# Patient Record
Sex: Female | Born: 1985
Health system: Southern US, Community
[De-identification: ages and names within clinical notes are randomized; demographics above are authoritative.]

## PROBLEM LIST (undated history)

## (undated) DIAGNOSIS — N200 Calculus of kidney: Secondary | ICD-10-CM

## (undated) DIAGNOSIS — A749 Chlamydial infection, unspecified: Secondary | ICD-10-CM

## (undated) DIAGNOSIS — R51 Headache: Secondary | ICD-10-CM

## (undated) HISTORY — PX: ANTERIOR CRUCIATE LIGAMENT REPAIR: SHX115

## (undated) HISTORY — PX: OTHER SURGICAL HISTORY: SHX169

---

## 2009-06-15 ENCOUNTER — Ambulatory Visit: Payer: Self-pay | Admitting: Diagnostic Radiology

## 2009-06-15 ENCOUNTER — Emergency Department (HOSPITAL_BASED_OUTPATIENT_CLINIC_OR_DEPARTMENT_OTHER): Admission: EM | Admit: 2009-06-15 | Discharge: 2009-06-15 | Payer: Self-pay | Admitting: Emergency Medicine

## 2009-07-14 ENCOUNTER — Emergency Department (HOSPITAL_BASED_OUTPATIENT_CLINIC_OR_DEPARTMENT_OTHER): Admission: EM | Admit: 2009-07-14 | Discharge: 2009-07-14 | Payer: Self-pay | Admitting: Emergency Medicine

## 2009-12-29 ENCOUNTER — Emergency Department (HOSPITAL_BASED_OUTPATIENT_CLINIC_OR_DEPARTMENT_OTHER): Admission: EM | Admit: 2009-12-29 | Discharge: 2009-12-29 | Payer: Self-pay | Admitting: Emergency Medicine

## 2010-08-21 LAB — WET PREP, GENITAL
Trich, Wet Prep: NONE SEEN
Yeast Wet Prep HPF POC: NONE SEEN

## 2010-08-21 LAB — URINALYSIS, ROUTINE W REFLEX MICROSCOPIC
Glucose, UA: NEGATIVE mg/dL
Ketones, ur: NEGATIVE mg/dL
Protein, ur: NEGATIVE mg/dL
Urobilinogen, UA: 1 mg/dL (ref 0.0–1.0)

## 2010-08-21 LAB — URINE MICROSCOPIC-ADD ON

## 2010-08-21 LAB — GC/CHLAMYDIA PROBE AMP, GENITAL: GC Probe Amp, Genital: NEGATIVE

## 2010-08-22 LAB — URINALYSIS, ROUTINE W REFLEX MICROSCOPIC
Bilirubin Urine: NEGATIVE
Hgb urine dipstick: NEGATIVE
Ketones, ur: NEGATIVE mg/dL
Nitrite: NEGATIVE

## 2010-08-22 LAB — PREGNANCY, URINE: Preg Test, Ur: NEGATIVE

## 2010-08-26 LAB — URINALYSIS, ROUTINE W REFLEX MICROSCOPIC
Glucose, UA: NEGATIVE mg/dL
Hgb urine dipstick: NEGATIVE
Specific Gravity, Urine: 1.021 (ref 1.005–1.030)
pH: 6.5 (ref 5.0–8.0)

## 2011-02-25 ENCOUNTER — Emergency Department (HOSPITAL_BASED_OUTPATIENT_CLINIC_OR_DEPARTMENT_OTHER)
Admission: EM | Admit: 2011-02-25 | Discharge: 2011-02-25 | Disposition: A | Discharge status: Home / Self Care | Payer: Self-pay | Attending: Emergency Medicine | Admitting: Emergency Medicine

## 2011-02-25 ENCOUNTER — Encounter: Payer: Self-pay | Admitting: *Deleted

## 2011-02-25 DIAGNOSIS — R109 Unspecified abdominal pain: Secondary | ICD-10-CM | POA: Insufficient documentation

## 2011-02-25 DIAGNOSIS — Z3400 Encounter for supervision of normal first pregnancy, unspecified trimester: Secondary | ICD-10-CM

## 2011-02-25 DIAGNOSIS — O2690 Pregnancy related conditions, unspecified, unspecified trimester: Secondary | ICD-10-CM | POA: Insufficient documentation

## 2011-02-25 HISTORY — DX: Calculus of kidney: N20.0

## 2011-02-25 LAB — WET PREP, GENITAL
Trich, Wet Prep: NONE SEEN
Yeast Wet Prep HPF POC: NONE SEEN

## 2011-02-25 LAB — URINALYSIS, ROUTINE W REFLEX MICROSCOPIC
Hgb urine dipstick: NEGATIVE
Protein, ur: NEGATIVE mg/dL
Urobilinogen, UA: 1 mg/dL (ref 0.0–1.0)

## 2011-02-25 MED ORDER — PRENATAL COMPLETE 14-0.4 MG PO TABS: 1.0000 | ORAL_TABLET | ORAL | Status: DC

## 2011-02-25 NOTE — ED Provider Notes (Signed)
History     CSN: [id number] Arrival date & time: 02/25/2011  7:29 PM  Chief Complaint  Patient presents with  . Abdominal Pain    HPI  (Consider location/radiation/quality/duration/timing/severity/associated sxs/prior treatment)  Patient is a 25 y.o. female presenting with abdominal pain. The history is provided by the patient. No language interpreter was used.  Abdominal Pain The primary symptoms of the illness do not include abdominal pain, nausea or vomiting. The current episode started 2 days ago. The onset of the illness was gradual. The problem has not changed since onset. Associated with: cramping with urination. The patient states that she believes she is currently not pregnant. The patient has not had a change in bowel habit. Symptoms associated with the illness do not include constipation, urgency, hematuria, frequency or back pain. Significant associated medical issues do not include PUD, GERD, inflammatory bowel disease, diabetes or diverticulitis.  Pt complains of lower abdominal cramping.  Pt reports she feels like she is cramping like when she has her period when she urinates.  Pt reports period is late.  Pt reports she is possibly pregnant.  Past Medical History  Diagnosis Date  . Kidney calculi     Past Surgical History  Procedure Date  . Joint replacement     History reviewed. No pertinent family history.  History  Substance Use Topics  . Smoking status: Current Everyday Smoker -- 1.0 packs/day  . Smokeless tobacco: Not on file  . Alcohol Use:     OB History    Grav Para Term Preterm Abortions TAB SAB Ect Mult Living                  Review of Systems  Review of Systems  Gastrointestinal: Negative for nausea, vomiting, abdominal pain and constipation.  Genitourinary: Negative for urgency, frequency and hematuria.  Musculoskeletal: Negative for back pain.  All other systems reviewed and are negative.    Allergies  Review of patient's allergies  indicates no known allergies.  Home Medications  No current outpatient prescriptions on file.  Physical Exam    BP 130/81  Pulse 108  Temp(Src) 98.2 F (36.8 C) (Oral)  Resp 16  Ht 5\' 1"  (1.549 m)  Wt 170 lb (77.111 kg)  BMI 32.12 kg/m2  SpO2 100%  LMP 01/11/2011  Physical Exam  Nursing note and vitals reviewed. Constitutional: She is oriented to person, place, and time. She appears well-developed and well-nourished.  HENT:  Head: Normocephalic and atraumatic.  Eyes: Conjunctivae and EOM are normal. Pupils are equal, round, and reactive to light.  Neck: Normal range of motion. Neck supple.  Cardiovascular: Normal rate.   Pulmonary/Chest: Effort normal and breath sounds normal.  Abdominal: Soft.  Musculoskeletal: Normal range of motion.  Neurological: She is alert and oriented to person, place, and time.  Skin: Skin is warm.  Psychiatric: She has a normal mood and affect.    ED Course  Procedures (including critical care time)   Labs Reviewed  URINALYSIS, ROUTINE W REFLEX MICROSCOPIC  PREGNANCY, URINE   No results found.   No diagnosis found.   MDM   Urine pregnancy positive,  U/A negative        Langston Masker, [place] 02/25/11 2026

## 2011-02-25 NOTE — ED Notes (Signed)
Pt c/o painful urination and lower abd pain

## 2011-02-26 ENCOUNTER — Encounter (HOSPITAL_COMMUNITY): Payer: Self-pay | Admitting: *Deleted

## 2011-02-26 ENCOUNTER — Inpatient Hospital Stay (HOSPITAL_COMMUNITY)
Admission: AD | Admit: 2011-02-26 | Discharge: 2011-02-26 | Disposition: A | Discharge status: Home / Self Care | Payer: Self-pay | Source: Ambulatory Visit | Attending: Obstetrics & Gynecology | Admitting: Obstetrics & Gynecology

## 2011-02-26 ENCOUNTER — Inpatient Hospital Stay (HOSPITAL_COMMUNITY): Payer: Self-pay

## 2011-02-26 DIAGNOSIS — R1031 Right lower quadrant pain: Secondary | ICD-10-CM | POA: Insufficient documentation

## 2011-02-26 DIAGNOSIS — R109 Unspecified abdominal pain: Secondary | ICD-10-CM

## 2011-02-26 DIAGNOSIS — O26899 Other specified pregnancy related conditions, unspecified trimester: Secondary | ICD-10-CM

## 2011-02-26 DIAGNOSIS — O99891 Other specified diseases and conditions complicating pregnancy: Secondary | ICD-10-CM | POA: Insufficient documentation

## 2011-02-26 LAB — CBC
HCT: 34.3 % — ABNORMAL LOW (ref 36.0–46.0)
Platelets: 303 10*3/uL (ref 150–400)
RBC: 3.99 MIL/uL (ref 3.87–5.11)
RDW: 12.9 % (ref 11.5–15.5)
WBC: 13.3 10*3/uL — ABNORMAL HIGH (ref 4.0–10.5)

## 2011-02-26 LAB — HCG, QUANTITATIVE, PREGNANCY: hCG, Beta Chain, Quant, S: 12417 m[IU]/mL — ABNORMAL HIGH (ref <5)

## 2011-02-26 NOTE — Progress Notes (Signed)
Pt states, " I was seen at [facility], and was discharged at around 9 pm. I have been having pain in my low abdomen for 2 wks. It is crampie on my right side."

## 2011-02-26 NOTE — ED Provider Notes (Signed)
History   Pt presents today c/o RLQ pain that comes and goes for the past 2 wks. She was seen earlier at [facility] and was sent to the MAU to r/o ectopic pregnancy. She denies vag dc, bleeding, fever, or any other sx at this time.  Chief Complaint  Patient presents with  . Abdominal Pain   HPI  OB History    Grav Para Term Preterm Abortions TAB SAB Ect Mult Living   1               Past Medical History  Diagnosis Date  . Kidney calculi     Past Surgical History  Procedure Date  . Joint replacement     No family history on file.  History  Substance Use Topics  . Smoking status: Current Everyday Smoker -- 1.0 packs/day  . Smokeless tobacco: Not on file  . Alcohol Use:     Allergies: No Known Allergies  Prescriptions prior to admission  Medication Sig Dispense Refill  . ibuprofen (ADVIL,MOTRIN) 200 MG tablet Take 200 mg by mouth every 6 (six) hours as needed. For pain       . Prenatal Vit-Fe Fumarate-FA (PRENATAL COMPLETE) 14-0.4 MG TABS Take 1 tablet by mouth 1 day or 1 dose.  60 each  0    Review of Systems  Constitutional: Negative for fever.  Cardiovascular: Negative for chest pain.  Gastrointestinal: Positive for abdominal pain. Negative for nausea, vomiting, diarrhea and constipation.  Genitourinary: Negative for dysuria, urgency, frequency and hematuria.  Neurological: Negative for dizziness and headaches.  Psychiatric/Behavioral: Negative for depression and suicidal ideas.   Physical Exam   Blood pressure 114/65, pulse 84, temperature 99.1 F (37.3 C), temperature source Oral, resp. rate 20, height 5' 6.75" (1.695 m), weight 166 lb 6 oz (75.467 kg), last menstrual period 01/11/2011.  Physical Exam  Constitutional: She is oriented to person, place, and time. She appears well-developed and well-nourished. No distress.  HENT:  Head: Normocephalic and atraumatic.  Eyes: EOM are normal. Pupils are equal, round, and reactive to light.  GI: Soft. She exhibits no  distension and no mass. There is no tenderness. There is no rebound and no guarding.  Neurological: She is alert and oriented to person, place, and time.  Skin: Skin is warm and dry. She is not diaphoretic.  Psychiatric: She has a normal mood and affect. Her behavior is normal. Judgment and thought content normal.    MAU Course  Procedures  Results for orders placed during the hospital encounter of 02/26/11 (from the past 24 hour(s))  CBC     Status: Abnormal   Collection Time   02/26/11  2:14 AM      Component Value Range   WBC 13.3 (*) 4.0 - 10.5 (K/uL)   RBC 3.99  3.87 - 5.11 (MIL/uL)   Hemoglobin 11.7 (*) 12.0 - 15.0 (g/dL)   HCT 08.6 (*) 57.8 - 46.0 (%)   MCV 86.0  78.0 - 100.0 (fL)   MCH 29.3  26.0 - 34.0 (pg)   MCHC 34.1  30.0 - 36.0 (g/dL)   RDW 46.9  62.9 - 52.8 (%)   Platelets 303  150 - 400 (K/uL)  HCG, QUANTITATIVE, PREGNANCY     Status: Abnormal   Collection Time   02/26/11  2:14 AM      Component Value Range   hCG, Beta Chain, Quant, S 12417 (*) <5 (mIU/mL)   US shows single IUP at 5.6wks with cardiac activity.  Assessment and  Plan  Preg: discussed with pt at length. She will begin prenatal care. Discussed diet, activity, risks, and precautions.  Clinton Gallant. Wessley Emert III, DrHSc, MPAS, PA-C  02/26/2011, 2:24 AM   Henrietta Hoover, PA 02/26/11 [phone]

## 2011-02-26 NOTE — ED Notes (Signed)
Henrietta Hoover PA in to discuss u/a results and plan for d/c

## 2011-02-28 LAB — GC/CHLAMYDIA PROBE AMP, GENITAL: GC Probe Amp, Genital: NEGATIVE

## 2011-03-06 NOTE — ED Provider Notes (Signed)
Medical screening examination/treatment/procedure(s) were performed by non-physician practitioner and as supervising physician I was immediately available for consultation/collaboration.  Nelia Shi, MD 03/06/11 0001

## 2011-04-01 ENCOUNTER — Inpatient Hospital Stay (HOSPITAL_COMMUNITY)
Admission: AD | Admit: 2011-04-01 | Discharge: 2011-04-02 | Disposition: A | Discharge status: Home / Self Care | Payer: Medicaid Other | Source: Ambulatory Visit | Attending: Family Medicine | Admitting: Family Medicine

## 2011-04-01 ENCOUNTER — Encounter (HOSPITAL_COMMUNITY): Payer: Self-pay

## 2011-04-01 DIAGNOSIS — O26899 Other specified pregnancy related conditions, unspecified trimester: Secondary | ICD-10-CM

## 2011-04-01 DIAGNOSIS — R51 Headache: Secondary | ICD-10-CM | POA: Insufficient documentation

## 2011-04-01 DIAGNOSIS — O210 Mild hyperemesis gravidarum: Secondary | ICD-10-CM

## 2011-04-01 DIAGNOSIS — O99891 Other specified diseases and conditions complicating pregnancy: Secondary | ICD-10-CM | POA: Insufficient documentation

## 2011-04-01 MED ORDER — IBUPROFEN 600 MG PO TABS
600.0000 mg | ORAL_TABLET | Freq: Once | ORAL | Status: AC
  Administered 2011-04-02: 600 mg via ORAL
  Filled 2011-04-01: qty 1

## 2011-04-01 NOTE — Progress Notes (Addendum)
Pt states bilateral temporal headache that she describes as throbbing & stabbing, 5/10 on pain scale. Pt states has been constant headache for the last week but has had headache intermittently before that for a month. Pt states took tylenol 2 days ago but it didn't help, has not taken pain medicine since then.

## 2011-04-02 LAB — URINE MICROSCOPIC-ADD ON

## 2011-04-02 LAB — URINALYSIS, ROUTINE W REFLEX MICROSCOPIC
Bilirubin Urine: NEGATIVE
Glucose, UA: NEGATIVE mg/dL
Hgb urine dipstick: NEGATIVE
Protein, ur: NEGATIVE mg/dL
Specific Gravity, Urine: >1.03 — ABNORMAL HIGH (ref 1.005–1.030)
Urobilinogen, UA: 0.2 mg/dL (ref 0.0–1.0)

## 2011-04-02 MED ORDER — PROMETHAZINE HCL 25 MG PO TABS: 25.0000 mg | ORAL_TABLET | Freq: Four times a day (QID) | ORAL | Status: AC | PRN

## 2011-04-02 NOTE — ED Provider Notes (Addendum)
History     Chief Complaint  Patient presents with  . Headache   HPI Melinda [name] 25 y.o. 11 w gestation.  comes to MAU with constant headache which is worse tonight. Headache is 5/10. Has not taken any medication for it.  Has not yet started prenatal care.  Has been having nausea and some vomiting.   OB History    Grav Para Term Preterm Abortions TAB SAB Ect Mult Living   1 0 0 0 0 0 0 0 0 0       Past Medical History  Diagnosis Date  . Kidney calculi     Past Surgical History  Procedure Date  . Joint replacement     No family history on file.  History  Substance Use Topics  . Smoking status: Former Smoker -- 1.0 packs/day    Quit date: 02/25/2011  . Smokeless tobacco: Not on file  . Alcohol Use: No    Allergies: No Known Allergies  Prescriptions prior to admission  Medication Sig Dispense Refill  . acetaminophen (TYLENOL) 500 MG tablet Take 500 mg by mouth every 6 (six) hours as needed. For pain or headache       . Prenatal Vit-Fe Fumarate-FA (PRENATAL COMPLETE) 14-0.4 MG TABS Take 1 tablet by mouth 1 day or 1 dose.  60 each  0    Review of Systems  Gastrointestinal: Positive for nausea and vomiting.  Neurological: Positive for headaches.   Physical Exam   Blood pressure 126/65, pulse 98, temperature 98.5 F (36.9 C), temperature source Oral, resp. rate 18, height 5' 7.5" (1.715 m), weight 177 lb 9.6 oz (80.559 kg), last menstrual period 01/11/2011, SpO2 99.00%.  Physical Exam  Nursing note and vitals reviewed. Constitutional: She is oriented to person, place, and time. She appears well-developed and well-nourished.  HENT:  Head: Normocephalic.  Eyes: EOM are normal.  Neck: Neck supple.  GI:       Unable to hear FHT with doppler.  Musculoskeletal: Normal range of motion.  Neurological: She is alert and oriented to person, place, and time.  Skin: Skin is warm and dry.  Psychiatric: She has a normal mood and affect.    MAU Course   Procedures  MDM Ibuprofen 600 mg given.  Headache is now 1/10 at 1 AM.  Client ready for discharge.  Assessment and Plan  Headache Morning sickness  Plan Drink at least 8 8-oz glasses of water every day. Take Tylenol 325 mg 2 tablets by mouth every 4 hours if needed for pain. Will prescribe Phenergan for vomiting. Begin prenatal care as soon as possible. Urine culture pending.  Client denies any urinary symptoms today.  Melinda [name] 04/02/2011, 12:58 AM   Nolene Bernheim, NP 04/02/11 0103  Nolene Bernheim, NP 04/05/11 2002

## 2011-04-06 NOTE — ED Provider Notes (Signed)
Chart reviewed and agree with management and plan.  

## 2011-04-08 ENCOUNTER — Encounter (HOSPITAL_COMMUNITY): Payer: Self-pay | Admitting: *Deleted

## 2011-04-08 ENCOUNTER — Inpatient Hospital Stay (HOSPITAL_COMMUNITY)
Admission: AD | Admit: 2011-04-08 | Discharge: 2011-04-08 | Disposition: A | Discharge status: Home / Self Care | Payer: Self-pay | Source: Ambulatory Visit | Attending: Obstetrics & Gynecology | Admitting: Obstetrics & Gynecology

## 2011-04-08 DIAGNOSIS — Z3490 Encounter for supervision of normal pregnancy, unspecified, unspecified trimester: Secondary | ICD-10-CM

## 2011-04-08 DIAGNOSIS — O99891 Other specified diseases and conditions complicating pregnancy: Secondary | ICD-10-CM | POA: Insufficient documentation

## 2011-04-08 DIAGNOSIS — O209 Hemorrhage in early pregnancy, unspecified: Secondary | ICD-10-CM

## 2011-04-08 DIAGNOSIS — N898 Other specified noninflammatory disorders of vagina: Secondary | ICD-10-CM | POA: Insufficient documentation

## 2011-04-08 DIAGNOSIS — Z6791 Unspecified blood type, Rh negative: Secondary | ICD-10-CM

## 2011-04-08 DIAGNOSIS — R109 Unspecified abdominal pain: Secondary | ICD-10-CM | POA: Insufficient documentation

## 2011-04-08 DIAGNOSIS — O26899 Other specified pregnancy related conditions, unspecified trimester: Secondary | ICD-10-CM

## 2011-04-08 HISTORY — DX: Chlamydial infection, unspecified: A74.9

## 2011-04-08 HISTORY — DX: Headache: R51

## 2011-04-08 LAB — ABO/RH: ABO/RH(D): O NEG

## 2011-04-08 MED ORDER — RHO D IMMUNE GLOBULIN 1500 UNIT/2ML IJ SOLN
300.0000 ug | Freq: Once | INTRAMUSCULAR | Status: AC
  Administered 2011-04-08: 300 ug via INTRAMUSCULAR

## 2011-04-08 NOTE — ED Provider Notes (Signed)
History     Chief Complaint  Patient presents with  . Abdominal Pain   HPI 25 y.o. G1 P0 at [redacted]w[redacted]d presents with c/o brown d/c today and one episode of cramping. No other symptoms. No recent intercourse. Still deciding on PN provider.  Has list    Past Medical History  Diagnosis Date  . Headache   . Kidney calculi     stones x2 in 2011  . Chlamydia     Past Surgical History  Procedure Date  . Anterior cruciate ligament repair     right knee    History reviewed. No pertinent family history.  History  Substance Use Topics  . Smoking status: Former Smoker -- 1.0 packs/day    Quit date: 02/25/2011  . Smokeless tobacco: Never Used  . Alcohol Use: No    Allergies: No Known Allergies  Prescriptions prior to admission  Medication Sig Dispense Refill  . acetaminophen (TYLENOL) 500 MG tablet Take 1,000 mg by mouth every 6 (six) hours as needed. For pain or headache      . Prenatal Vit-Fe Fumarate-FA (PRENATAL COMPLETE) 14-0.4 MG TABS Take 1 tablet by mouth 1 day or 1 dose.  60 each  0  . promethazine (PHENERGAN) 25 MG tablet Take 1 tablet (25 mg total) by mouth every 6 (six) hours as needed for nausea. May take 1/2 tablet if desired.  Sedation precautions.  30 tablet  0    ROS See above Physical Exam   Blood pressure 118/86, pulse 85, temperature 98.5 F (36.9 C), temperature source Oral, resp. rate 20, height 5\' 8"  (1.727 m), weight 178 lb (80.74 kg), last menstrual period 01/11/2011.  Physical Exam  Constitutional: She is oriented to person, place, and time. She appears well-developed and well-nourished. No distress.  HENT:  Head: Normocephalic.  Respiratory: Effort normal.  GI: Soft. She exhibits no distension and no mass. There is no tenderness. There is no rebound and no guarding.  Genitourinary: Vagina normal and uterus normal. No vaginal discharge (No blood in vault) found.  Musculoskeletal: Normal range of motion.  Neurological: She is alert and oriented to  person, place, and time.  Skin: Skin is warm and dry.  Psychiatric: She has a normal mood and affect.   FHR 162 Blood TYpe O Neg MAU Course  Procedures  Assessment and Plan  A:  Reassuring IUP fetal status      Brown D/C probably reflects old blood, no current bleeding P:  Reassured      Discussed PN care options. Wants to deliver at [facility].     Rhophylac before discharge  [facility] 04/08/2011, 3:50 PM

## 2011-04-08 NOTE — Progress Notes (Signed)
Was at work, started cramping, went to restroom and saw dk spotting.  No hx of bleeding during pregnancy

## 2011-04-08 NOTE — ED Notes (Signed)
Pt informed of blood type, need for rhophyllac.  Time discussed.

## 2011-04-08 NOTE — ED Notes (Signed)
Explained reason for blood draw to pt.  Time frame associated with labwork and results.  Pt will go to lobby to wait on results.

## 2011-04-08 NOTE — ED Notes (Signed)
Tolerated injection.

## 2011-04-09 LAB — RH IG WORKUP (INCLUDES ABO/RH)
ABO/RH(D): O NEG
Gestational Age(Wks): 12

## 2011-04-19 ENCOUNTER — Emergency Department (HOSPITAL_BASED_OUTPATIENT_CLINIC_OR_DEPARTMENT_OTHER)
Admission: EM | Admit: 2011-04-19 | Discharge: 2011-04-19 | Disposition: A | Discharge status: Home / Self Care | Payer: Medicaid Other | Attending: Emergency Medicine | Admitting: Emergency Medicine

## 2011-04-19 ENCOUNTER — Encounter (HOSPITAL_BASED_OUTPATIENT_CLINIC_OR_DEPARTMENT_OTHER): Payer: Self-pay

## 2011-04-19 DIAGNOSIS — M25519 Pain in unspecified shoulder: Secondary | ICD-10-CM | POA: Insufficient documentation

## 2011-04-19 DIAGNOSIS — M549 Dorsalgia, unspecified: Secondary | ICD-10-CM | POA: Insufficient documentation

## 2011-04-19 HISTORY — DX: Calculus of kidney: N20.0

## 2011-04-19 NOTE — ED Provider Notes (Signed)
History     CSN: [id number] Arrival date & time: 04/19/2011  3:00 PM   First MD Initiated Contact with Patient 04/19/11 1515      Chief Complaint  Patient presents with  . Shoulder Pain  . Back Pain    (Consider location/radiation/quality/duration/timing/severity/associated sxs/prior treatment) HPI Comments: Pt states that she is sore in her right neck and shoulder:pt states that she has had similar symptoms in the past, but she was able to take muscle relaxer but can't related to pregnancy  Patient is a 25 y.o. female presenting with shoulder pain and back pain. The history is provided by the patient. No language interpreter was used.  Shoulder Pain This is a new problem. The current episode started in the past 7 days. The problem occurs constantly. The problem has been unchanged. The symptoms are aggravated by twisting. She has tried acetaminophen for the symptoms. The treatment provided mild relief.  Back Pain     Past Medical History  Diagnosis Date  . Headache   . Kidney calculi     stones x2 in 2011  . Chlamydia   . Kidney stones     Past Surgical History  Procedure Date  . Anterior cruciate ligament repair     right knee  . Right knee surgery     No family history on file.  History  Substance Use Topics  . Smoking status: Former Smoker -- 1.0 packs/day    Quit date: 02/25/2011  . Smokeless tobacco: Never Used  . Alcohol Use: No    OB History    Grav Para Term Preterm Abortions TAB SAB Ect Mult Living   1 0 0 0 0 0 0 0 0 0       Review of Systems  Constitutional: Negative.   Cardiovascular: Negative.   Musculoskeletal: Positive for back pain.  Neurological: Negative.   Psychiatric/Behavioral: Negative.     Allergies  Review of patient's allergies indicates no known allergies.  Home Medications   Current Outpatient Rx  Name Route Sig Dispense Refill  . ACETAMINOPHEN 500 MG PO TABS Oral Take 1,000 mg by mouth every 6 (six) hours as needed.  For pain or headache    . PRENATAL COMPLETE 14-0.4 MG PO TABS Oral Take 1 tablet by mouth 1 day or 1 dose. 60 each 0    BP 137/68  Pulse 103  Temp(Src) 98.3 F (36.8 C) (Oral)  Resp 16  Ht 5\' 8"  (1.727 m)  Wt 178 lb (80.74 kg)  BMI 27.06 kg/m2  SpO2 100%  LMP 01/11/2011  Physical Exam  Nursing note and vitals reviewed. Constitutional: She is oriented to person, place, and time. She appears well-developed and well-nourished.  HENT:  Head: Normocephalic and atraumatic.  Cardiovascular: Normal rate and regular rhythm.   Pulmonary/Chest: Effort normal and breath sounds normal.  Abdominal: Soft.  Musculoskeletal:       Pt tender along the right shoulder blade:no gross deformity noted:pt has full BJY:NWGNFA intact  Neurological: She is alert and oriented to person, place, and time.    ED Course  Procedures (including critical care time)  Labs Reviewed - No data to display No results found.   1. Back pain       MDM  Pt has likely muscle spasms:discussed with pt that she can take tylenol:no imagining needed as this time        Teressa Lower, NP 04/19/11 1533  Teressa Lower, NP 04/19/11 1533

## 2011-04-19 NOTE — ED Provider Notes (Signed)
History/physical exam/procedure(s) were performed by non-physician practitioner and as supervising physician I was immediately available for consultation/collaboration. I have reviewed all notes and am in agreement with care and plan.   Blaine Guiffre S Terral Cooks, MD 04/19/11 2229 

## 2011-04-19 NOTE — ED Notes (Signed)
Pt reports severe right shoulder pain radiating to neck and back.  Onset 5 days ago.

## 2012-02-22 ENCOUNTER — Emergency Department (HOSPITAL_BASED_OUTPATIENT_CLINIC_OR_DEPARTMENT_OTHER)
Admission: EM | Admit: 2012-02-22 | Discharge: 2012-02-22 | Disposition: A | Discharge status: Home / Self Care | Payer: Self-pay | Attending: Emergency Medicine | Admitting: Emergency Medicine

## 2012-02-22 ENCOUNTER — Encounter (HOSPITAL_BASED_OUTPATIENT_CLINIC_OR_DEPARTMENT_OTHER): Payer: Self-pay

## 2012-02-22 DIAGNOSIS — L02219 Cutaneous abscess of trunk, unspecified: Secondary | ICD-10-CM | POA: Insufficient documentation

## 2012-02-22 DIAGNOSIS — L0291 Cutaneous abscess, unspecified: Secondary | ICD-10-CM

## 2012-02-22 DIAGNOSIS — Z87891 Personal history of nicotine dependence: Secondary | ICD-10-CM | POA: Insufficient documentation

## 2012-02-22 MED ORDER — SULFAMETHOXAZOLE-TRIMETHOPRIM 800-160 MG PO TABS: 1.0000 | ORAL_TABLET | Freq: Two times a day (BID) | ORAL | Status: DC

## 2012-02-22 NOTE — ED Provider Notes (Signed)
History     CSN: [id number]  Arrival date & time 02/22/12  1320   None     Chief Complaint  Patient presents with  . Abscess    (Consider location/radiation/quality/duration/timing/severity/associated sxs/prior treatment) Patient is a 26 y.o. female presenting with rash. The history is provided by the patient. No language interpreter was used.  Rash  This is a new problem. The current episode started more than 2 days ago. The problem has not changed since onset.The problem is associated with nothing. There has been no fever. The rash is present on the groin and genitalia. The pain is at a severity of 2/10. The pain is mild. The treatment provided no relief.    Past Medical History  Diagnosis Date  . Headache   . Kidney calculi     stones x2 in 2011  . Chlamydia   . Kidney stones     Past Surgical History  Procedure Date  . Anterior cruciate ligament repair     right knee  . Right knee surgery     No family history on file.  History  Substance Use Topics  . Smoking status: Former Smoker -- 1.0 packs/day    Quit date: 02/25/2011  . Smokeless tobacco: Never Used  . Alcohol Use: No    OB History    Grav Para Term Preterm Abortions TAB SAB Ect Mult Living   1 0 0 0 0 0 0 0 0 0       Review of Systems  Skin: Positive for rash.  All other systems reviewed and are negative.    Allergies  Review of patient's allergies indicates no known allergies.  Home Medications   Current Outpatient Rx  Name Route Sig Dispense Refill  . PRENATAL 1 PO Oral Take by mouth.    . ACETAMINOPHEN 500 MG PO TABS Oral Take 1,000 mg by mouth every 6 (six) hours as needed. For pain or headache    . PRENATAL COMPLETE 14-0.4 MG PO TABS Oral Take 1 tablet by mouth 1 day or 1 dose. 60 each 0    BP 140/84  Pulse 95  Temp 98.4 F (36.9 C) (Oral)  Resp 18  Ht 5\' 9"  (1.753 m)  Wt 225 lb (102.059 kg)  BMI 33.23 kg/m2  SpO2 99%  LMP 02/03/2012  Breastfeeding? Unknown  Physical  Exam  Nursing note and vitals reviewed. Constitutional: She is oriented to person, place, and time. She appears well-developed and well-nourished.  HENT:  Head: Normocephalic.  Eyes: Conjunctivae normal are normal. Pupils are equal, round, and reactive to light.  Cardiovascular: Normal rate.   Pulmonary/Chest: Effort normal.  Musculoskeletal: Normal range of motion.       Open abscess left suprabubic,  Scattered pustules inner thighs  Neurological: She is alert and oriented to person, place, and time.  Skin: There is erythema.  Psychiatric: She has a normal mood and affect.    ED Course  Procedures (including critical care time)  Labs Reviewed - No data to display No results found.   1. Abscess       MDM          Elson Areas, PA 02/22/12 1445

## 2012-02-22 NOTE — ED Notes (Signed)
C/o "multiple boils" to vaginal area

## 2012-02-22 NOTE — ED Provider Notes (Signed)
Medical screening examination/treatment/procedure(s) were performed by non-physician practitioner and as supervising physician I was immediately available for consultation/collaboration.  Ethelda Chick, MD 02/22/12 [phone]

## 2013-05-12 ENCOUNTER — Emergency Department (HOSPITAL_BASED_OUTPATIENT_CLINIC_OR_DEPARTMENT_OTHER)
Admission: EM | Admit: 2013-05-12 | Discharge: 2013-05-12 | Disposition: A | Discharge status: Home / Self Care | Payer: Medicaid Other | Attending: Emergency Medicine | Admitting: Emergency Medicine

## 2013-05-12 ENCOUNTER — Encounter (HOSPITAL_BASED_OUTPATIENT_CLINIC_OR_DEPARTMENT_OTHER): Payer: Self-pay | Admitting: Emergency Medicine

## 2013-05-12 DIAGNOSIS — Z8619 Personal history of other infectious and parasitic diseases: Secondary | ICD-10-CM | POA: Insufficient documentation

## 2013-05-12 DIAGNOSIS — B9789 Other viral agents as the cause of diseases classified elsewhere: Secondary | ICD-10-CM | POA: Insufficient documentation

## 2013-05-12 DIAGNOSIS — B349 Viral infection, unspecified: Secondary | ICD-10-CM

## 2013-05-12 DIAGNOSIS — Z87891 Personal history of nicotine dependence: Secondary | ICD-10-CM | POA: Insufficient documentation

## 2013-05-12 DIAGNOSIS — J029 Acute pharyngitis, unspecified: Secondary | ICD-10-CM | POA: Insufficient documentation

## 2013-05-12 DIAGNOSIS — Z87442 Personal history of urinary calculi: Secondary | ICD-10-CM | POA: Insufficient documentation

## 2013-05-12 DIAGNOSIS — R000 Tachycardia, unspecified: Secondary | ICD-10-CM | POA: Insufficient documentation

## 2013-05-12 LAB — RAPID STREP SCREEN (MED CTR MEBANE ONLY): Streptococcus, Group A Screen (Direct): NEGATIVE

## 2013-05-12 MED ORDER — DEXAMETHASONE SODIUM PHOSPHATE 10 MG/ML IJ SOLN
10.0000 mg | Freq: Once | INTRAMUSCULAR | Status: AC
Start: 2013-05-12 — End: 2013-05-12
  Administered 2013-05-12: 10 mg via INTRAMUSCULAR
  Filled 2013-05-12: qty 1

## 2013-05-12 NOTE — ED Notes (Signed)
Patient here with sore throat x 3 days, pain to swallow. Also complains that her ears feel full and bilateral earache

## 2013-05-12 NOTE — ED Provider Notes (Signed)
CSN: [id number]     Arrival date & time 05/12/13  1351 History   First MD Initiated Contact with Patient 05/12/13 1408     Chief Complaint  Patient presents with  . Sore Throat   (Consider location/radiation/quality/duration/timing/severity/associated sxs/prior Treatment) Patient is a 27 y.o. female presenting with pharyngitis. The history is provided by the patient.  Sore Throat This is a new problem. The current episode started in the past 7 days. The problem occurs constantly. The problem has been gradually worsening. Associated symptoms include chills, congestion, a fever, a sore throat and swollen glands. Pertinent negatives include no abdominal pain, anorexia, coughing, headaches, nausea, neck pain or rash. The symptoms are aggravated by eating. She has tried drinking and rest for the symptoms.   Melinda [name] is a 27 y.o. female who presents to the ED with sore throat, fever and chills that started 3 days ago. She has taken OTC medications without relief.   Past Medical History  Diagnosis Date  . Headache(784.0)   . Kidney calculi     stones x2 in 2011  . Chlamydia   . Kidney stones    Past Surgical History  Procedure Laterality Date  . Anterior cruciate ligament repair      right knee  . Right knee surgery     No family history on file. History  Substance Use Topics  . Smoking status: Former Smoker -- 1.00 packs/day    Quit date: 02/25/2011  . Smokeless tobacco: Never Used  . Alcohol Use: No   OB History   Grav Para Term Preterm Abortions TAB SAB Ect Mult Living   1 0 0 0 0 0 0 0 0 0      Review of Systems  Constitutional: Positive for fever and chills.  HENT: Positive for congestion and sore throat. Trouble swallowing: due to pain.   Eyes: Negative for discharge and itching.  Respiratory: Negative for cough.   Gastrointestinal: Negative for nausea, abdominal pain and anorexia.  Genitourinary: Negative for dysuria, urgency and frequency.  Musculoskeletal: Negative  for back pain and neck pain.  Skin: Negative for rash.  Neurological: Negative for syncope and headaches.  Psychiatric/Behavioral: Negative for confusion. The patient is not nervous/anxious.     Allergies  Review of patient's allergies indicates no known allergies.  Home Medications   Current Outpatient Rx  Name  Route  Sig  Dispense  Refill  . acetaminophen (TYLENOL) 500 MG tablet   Oral   Take 1,000 mg by mouth every 6 (six) hours as needed. For pain or headache          BP 151/98  Pulse 118  Temp(Src) 99.2 F (37.3 C) (Oral)  Resp 18  SpO2 100% Physical Exam  Nursing note and vitals reviewed. Constitutional: She is oriented to person, place, and time. She appears well-developed and well-nourished. No distress.  HENT:  Head: Normocephalic and atraumatic.  Right Ear: Tympanic membrane normal.  Left Ear: Tympanic membrane normal.  Nose: Nose normal.  Mouth/Throat: Uvula is midline and mucous membranes are normal. Oropharyngeal exudate and posterior oropharyngeal erythema present.  Tonsils enlarged with exudate. Patient swallowing saliva without difficulty.   Eyes: EOM are normal.  Neck: Normal range of motion. Neck supple.  Cardiovascular: Tachycardia present.   Pulmonary/Chest: Effort normal and breath sounds normal.  Abdominal: Soft. There is no tenderness.  Musculoskeletal: Normal range of motion.  Lymphadenopathy:    She has cervical adenopathy.  Neurological: She is alert and oriented to person, place, and time.  No cranial nerve deficit.  Skin: Skin is warm and dry.  Psychiatric: She has a normal mood and affect. Her behavior is normal.    ED Course: Dr. Criss Alvine in to examine the patient.   Procedures  MDM  27 y.o. female with exudative pharyngitis. Negative strep screen, negative mono test. Will treat as viral pharyngitis. Decadron 10 mg IM given here in the ED. Discussed with the patient clinical and lab findings and all questioned fully answered. She will  return if any problems arise. Instructed to use salt water gargles, chloraseptic spray, ibuprofen/tylenol and follow up with PCP. Patient voices understanding.     [facility] Orlene Och, [place] 05/12/13 [phone]

## 2013-05-14 ENCOUNTER — Encounter (HOSPITAL_BASED_OUTPATIENT_CLINIC_OR_DEPARTMENT_OTHER): Payer: Self-pay | Admitting: Emergency Medicine

## 2013-05-14 ENCOUNTER — Emergency Department (HOSPITAL_BASED_OUTPATIENT_CLINIC_OR_DEPARTMENT_OTHER)
Admission: EM | Admit: 2013-05-14 | Discharge: 2013-05-14 | Disposition: A | Discharge status: Home / Self Care | Payer: Medicaid Other | Attending: Emergency Medicine | Admitting: Emergency Medicine

## 2013-05-14 ENCOUNTER — Emergency Department (HOSPITAL_BASED_OUTPATIENT_CLINIC_OR_DEPARTMENT_OTHER): Payer: Medicaid Other

## 2013-05-14 DIAGNOSIS — Z87442 Personal history of urinary calculi: Secondary | ICD-10-CM | POA: Insufficient documentation

## 2013-05-14 DIAGNOSIS — J029 Acute pharyngitis, unspecified: Secondary | ICD-10-CM | POA: Insufficient documentation

## 2013-05-14 DIAGNOSIS — Z8619 Personal history of other infectious and parasitic diseases: Secondary | ICD-10-CM | POA: Insufficient documentation

## 2013-05-14 DIAGNOSIS — J159 Unspecified bacterial pneumonia: Secondary | ICD-10-CM | POA: Insufficient documentation

## 2013-05-14 DIAGNOSIS — J189 Pneumonia, unspecified organism: Secondary | ICD-10-CM

## 2013-05-14 DIAGNOSIS — Z792 Long term (current) use of antibiotics: Secondary | ICD-10-CM | POA: Insufficient documentation

## 2013-05-14 DIAGNOSIS — Z87891 Personal history of nicotine dependence: Secondary | ICD-10-CM | POA: Insufficient documentation

## 2013-05-14 LAB — BASIC METABOLIC PANEL
BUN: 13 mg/dL (ref 6–23)
Calcium: 9.1 mg/dL (ref 8.4–10.5)
Creatinine, Ser: 0.7 mg/dL (ref 0.50–1.10)
GFR calc Af Amer: >90 mL/min (ref >90)
GFR calc non Af Amer: >90 mL/min (ref >90)
Glucose, Bld: 94 mg/dL (ref 70–99)

## 2013-05-14 LAB — CULTURE, GROUP A STREP

## 2013-05-14 MED ORDER — ONDANSETRON HCL 4 MG/2ML IJ SOLN
4.0000 mg | Freq: Once | INTRAMUSCULAR | Status: AC
  Administered 2013-05-14: 4 mg via INTRAVENOUS
  Filled 2013-05-14: qty 2

## 2013-05-14 MED ORDER — AZITHROMYCIN 250 MG PO TABS
500.0000 mg | ORAL_TABLET | Freq: Once | ORAL | Status: AC
  Administered 2013-05-14: 500 mg via ORAL
  Filled 2013-05-14: qty 2

## 2013-05-14 MED ORDER — AZITHROMYCIN 250 MG PO TABS: ORAL_TABLET | ORAL | Status: DC

## 2013-05-14 MED ORDER — KETOROLAC TROMETHAMINE 30 MG/ML IJ SOLN
30.0000 mg | Freq: Once | INTRAMUSCULAR | Status: AC
Start: 2013-05-14 — End: 2013-05-14
  Administered 2013-05-14: 30 mg via INTRAVENOUS
  Filled 2013-05-14: qty 1

## 2013-05-14 MED ORDER — SODIUM CHLORIDE 0.9 % IV BOLUS (SEPSIS)
1000.0000 mL | Freq: Once | INTRAVENOUS | Status: AC
  Administered 2013-05-14: 1000 mL via INTRAVENOUS

## 2013-05-14 NOTE — ED Provider Notes (Signed)
CSN: [id number]     Arrival date & time 05/14/13  1848 History   First MD Initiated Contact with Patient 05/14/13 1858     Chief Complaint  Patient presents with  . Cough   (Consider location/radiation/quality/duration/timing/severity/associated sxs/prior Treatment) HPI Comments: Pt state that she had had a cough sore throat and fever for multiple days:pt states that she was seen in the er 2 days ago and she was give and shot of steroids and sent home:pt states that the symptoms are not getting better and she isn't able to drink anything because of the pain in her throat, has been taking tylenol and motrin  The history is provided by the patient. No language interpreter was used.    Past Medical History  Diagnosis Date  . Headache(784.0)   . Kidney calculi     stones x2 in 2011  . Chlamydia   . Kidney stones    Past Surgical History  Procedure Laterality Date  . Anterior cruciate ligament repair      right knee  . Right knee surgery     No family history on file. History  Substance Use Topics  . Smoking status: Former Smoker -- 1.00 packs/day    Quit date: 02/25/2011  . Smokeless tobacco: Never Used  . Alcohol Use: No   OB History   Grav Para Term Preterm Abortions TAB SAB Ect Mult Living   1 0 0 0 0 0 0 0 0 0      Review of Systems  Constitutional: Negative.   HENT: Positive for sore throat.   Respiratory: Positive for cough.   Cardiovascular: Negative.     Allergies  Review of patient's allergies indicates no known allergies.  Home Medications   Current Outpatient Rx  Name  Route  Sig  Dispense  Refill  . acetaminophen (TYLENOL) 500 MG tablet   Oral   Take 1,000 mg by mouth every 6 (six) hours as needed. For pain or headache         . azithromycin (ZITHROMAX) 250 MG tablet      1 every day until finished.   4 tablet   0    BP 135/76  Pulse 101  Temp(Src) 99.7 F (37.6 C) (Oral)  Resp 16  Ht 5\' 7"  (1.702 m)  Wt 225 lb (102.059 kg)  BMI 35.23  kg/m2  SpO2 99%  LMP 05/14/2013 Physical Exam  Nursing note and vitals reviewed. Constitutional: She is oriented to person, place, and time. She appears well-developed and well-nourished.  HENT:  Right Ear: External ear normal.  Left Ear: External ear normal.  Mouth/Throat: Oropharyngeal exudate and posterior oropharyngeal erythema present.  Cardiovascular: Normal rate and regular rhythm.   Pulmonary/Chest: Effort normal and breath sounds normal.  Musculoskeletal: Normal range of motion.  Neurological: She is alert and oriented to person, place, and time.  Skin: Skin is warm and dry.  Psychiatric: She has a normal mood and affect.    ED Course  Procedures (including critical care time) Labs Review Labs Reviewed  BASIC METABOLIC PANEL   Imaging Review Dg Chest 2 View  05/14/2013   CLINICAL DATA:  Cough and fever. Sore throat. Right-sided chest pain.  EXAM: CHEST  2 VIEW  COMPARISON:  Two-view chest 06/15/2009  FINDINGS: The heart size is normal. Ill-defined airspace disease is present the right lower lobe. The left lung is clear. The visualized soft tissues and bony thorax are otherwise unremarkable.  IMPRESSION: 1. Mild right lower lobe airspace disease. While  this may represent atelectasis, early infection is not excluded. 2. Low lung volumes.   Electronically Signed   By: Gennette Pac M.D.   On: 05/14/2013 19:58    EKG Interpretation   None       MDM   1. Community acquired pneumonia    Will treat for pneumonia with antibiotics:pt feeling better after toradol and fluids    Teressa Lower, NP 05/14/13 2140

## 2013-05-14 NOTE — ED Notes (Addendum)
Cough. Was seen here 2 days ago for tonsillitis and virus. Feels no better. Last Tylenol was 5 hours ago.

## 2013-05-14 NOTE — ED Provider Notes (Signed)
Medical screening examination/treatment/procedure(s) were performed by non-physician practitioner and as supervising physician I was immediately available for consultation/collaboration.     Geoffery Lyons, MD 05/14/13 [phone]

## 2013-05-14 NOTE — ED Provider Notes (Signed)
Medical screening examination/treatment/procedure(s) were performed by non-physician practitioner and as supervising physician I was immediately available for consultation/collaboration.     Geoffery Lyons, MD 05/14/13 [phone]

## 2013-06-16 ENCOUNTER — Emergency Department (HOSPITAL_BASED_OUTPATIENT_CLINIC_OR_DEPARTMENT_OTHER)
Admission: EM | Admit: 2013-06-16 | Discharge: 2013-06-16 | Disposition: A | Discharge status: Home / Self Care | Payer: Medicaid Other | Attending: Emergency Medicine | Admitting: Emergency Medicine

## 2013-06-16 ENCOUNTER — Encounter (HOSPITAL_BASED_OUTPATIENT_CLINIC_OR_DEPARTMENT_OTHER): Payer: Self-pay | Admitting: Emergency Medicine

## 2013-06-16 DIAGNOSIS — Z87442 Personal history of urinary calculi: Secondary | ICD-10-CM | POA: Insufficient documentation

## 2013-06-16 DIAGNOSIS — R05 Cough: Secondary | ICD-10-CM | POA: Insufficient documentation

## 2013-06-16 DIAGNOSIS — Z3202 Encounter for pregnancy test, result negative: Secondary | ICD-10-CM | POA: Insufficient documentation

## 2013-06-16 DIAGNOSIS — R109 Unspecified abdominal pain: Secondary | ICD-10-CM

## 2013-06-16 DIAGNOSIS — R059 Cough, unspecified: Secondary | ICD-10-CM | POA: Insufficient documentation

## 2013-06-16 DIAGNOSIS — R1031 Right lower quadrant pain: Secondary | ICD-10-CM | POA: Insufficient documentation

## 2013-06-16 DIAGNOSIS — Z8619 Personal history of other infectious and parasitic diseases: Secondary | ICD-10-CM | POA: Insufficient documentation

## 2013-06-16 DIAGNOSIS — Z87891 Personal history of nicotine dependence: Secondary | ICD-10-CM | POA: Insufficient documentation

## 2013-06-16 DIAGNOSIS — M549 Dorsalgia, unspecified: Secondary | ICD-10-CM | POA: Insufficient documentation

## 2013-06-16 DIAGNOSIS — Z8701 Personal history of pneumonia (recurrent): Secondary | ICD-10-CM | POA: Insufficient documentation

## 2013-06-16 LAB — CBC WITH DIFFERENTIAL/PLATELET
Basophils Absolute: 0 10*3/uL (ref 0.0–0.1)
Basophils Relative: 0 % (ref 0–1)
EOS PCT: 1 % (ref 0–5)
Eosinophils Absolute: 0.1 10*3/uL (ref 0.0–0.7)
HCT: 35.1 % — ABNORMAL LOW (ref 36.0–46.0)
Hemoglobin: 11.3 g/dL — ABNORMAL LOW (ref 12.0–15.0)
LYMPHS ABS: 3.1 10*3/uL (ref 0.7–4.0)
LYMPHS PCT: 25 % (ref 12–46)
MCH: 27.6 pg (ref 26.0–34.0)
MCHC: 32.2 g/dL (ref 30.0–36.0)
MCV: 85.6 fL (ref 78.0–100.0)
Monocytes Absolute: 0.6 10*3/uL (ref 0.1–1.0)
Monocytes Relative: 5 % (ref 3–12)
NEUTROS ABS: 8.5 10*3/uL — AB (ref 1.7–7.7)
NEUTROS PCT: 69 % (ref 43–77)
PLATELETS: 320 10*3/uL (ref 150–400)
RBC: 4.1 MIL/uL (ref 3.87–5.11)
RDW: 13.7 % (ref 11.5–15.5)
WBC: 12.3 10*3/uL — AB (ref 4.0–10.5)

## 2013-06-16 LAB — URINALYSIS, ROUTINE W REFLEX MICROSCOPIC
Bilirubin Urine: NEGATIVE
Glucose, UA: NEGATIVE mg/dL
Hgb urine dipstick: NEGATIVE
KETONES UR: NEGATIVE mg/dL
NITRITE: NEGATIVE
PH: 6.5 (ref 5.0–8.0)
Protein, ur: NEGATIVE mg/dL
SPECIFIC GRAVITY, URINE: 1.026 (ref 1.005–1.030)
Urobilinogen, UA: 0.2 mg/dL (ref 0.0–1.0)

## 2013-06-16 LAB — URINE MICROSCOPIC-ADD ON

## 2013-06-16 LAB — BASIC METABOLIC PANEL
BUN: 16 mg/dL (ref 6–23)
CHLORIDE: 103 meq/L (ref 96–112)
CO2: 26 meq/L (ref 19–32)
Calcium: 9.3 mg/dL (ref 8.4–10.5)
Creatinine, Ser: 0.8 mg/dL (ref 0.50–1.10)
GFR calc Af Amer: >90 mL/min (ref >90)
GFR calc non Af Amer: >90 mL/min (ref >90)
GLUCOSE: 110 mg/dL — AB (ref 70–99)
POTASSIUM: 4.2 meq/L (ref 3.7–5.3)
SODIUM: 143 meq/L (ref 137–147)

## 2013-06-16 LAB — PREGNANCY, URINE: PREG TEST UR: NEGATIVE

## 2013-06-16 MED ORDER — OXYCODONE-ACETAMINOPHEN 5-325 MG PO TABS: 1.0000 | ORAL_TABLET | Freq: Once | ORAL | Status: DC

## 2013-06-16 NOTE — ED Provider Notes (Signed)
CSN: [id number]     Arrival date & time 06/16/13  4098 History  This chart was scribed for non-physician practitioner Kerrie Buffalo, NP working with Hurman Horn, MD by Dorothey Baseman, ED Scribe. This patient was seen in room MH07/MH07 and the patient's care was started at 9:03 PM.    Chief Complaint  Patient presents with  . Abdominal Pain   The history is provided by the patient. No language interpreter was used.   HPI Comments: Melinda [name] is a 28 y.o. Female with a history of kidney stones who presents to the Emergency Department complaining of an intermittent pain to the RLQ of the abdomen, 4/10 at its worst and 0/10 currently, with associated nausea onset earlier today. She reports an associated itching "knot" around the area. Patient is also complaining of an intermittent pain to the right side of the back that radiates to the right-sided ribs onset about 10 days ago. She denies any potential injury or trauma to the area. Patient reports an associated cough secondary to her recent diagnosis of pneumonia about a month ago. She denies emesis, fever, chills, diarrhea, sore throat, urinary frequency, dysuria, dyspareunia, vaginal discharge or bleeding. Patient reports that she has had unprotected sex recently and is not currently on birth control, but states that she does not believe she is pregnant. Patient has no other pertinent medical history.   Past Medical History  Diagnosis Date  . Headache(784.0)   . Kidney calculi     stones x2 in 2011  . Chlamydia   . Kidney stones    Past Surgical History  Procedure Laterality Date  . Anterior cruciate ligament repair      right knee  . Right knee surgery     No family history on file. History  Substance Use Topics  . Smoking status: Former Smoker -- 1.00 packs/day    Quit date: 02/25/2011  . Smokeless tobacco: Never Used  . Alcohol Use: No   OB History   Grav Para Term Preterm Abortions TAB SAB Ect Mult Living   1 0 0 0 0 0 0 0 0 0       Review of Systems  Constitutional: Negative for fever and chills.  HENT: Negative for sore throat.   Respiratory: Positive for cough.   Gastrointestinal: Positive for nausea and abdominal pain. Negative for vomiting and diarrhea.  Genitourinary: Negative for dysuria, frequency, vaginal bleeding, vaginal discharge and dyspareunia.  Musculoskeletal: Positive for back pain.    Allergies  Review of patient's allergies indicates no known allergies.  Home Medications   Current Outpatient Rx  Name  Route  Sig  Dispense  Refill  . acetaminophen (TYLENOL) 500 MG tablet   Oral   Take 1,000 mg by mouth every 6 (six) hours as needed. For pain or headache         . azithromycin (ZITHROMAX) 250 MG tablet      1 every day until finished.   4 tablet   0    Triage Vitals: BP 175/94  Pulse 73  Temp(Src) 98.4 F (36.9 C) (Oral)  Resp 20  Ht 5\' 9"  (1.753 m)  Wt 260 lb (117.935 kg)  BMI 38.38 kg/m2  SpO2 100%  LMP 06/02/2013  Physical Exam  Nursing note and vitals reviewed. Constitutional: She is oriented to person, place, and time. She appears well-developed and well-nourished. No distress.  HENT:  Head: Normocephalic and atraumatic.  Mouth/Throat: Oropharynx is clear and moist. No oropharyngeal exudate.  Eyes: Conjunctivae and  EOM are normal. Pupils are equal, round, and reactive to light.  Neck: Normal range of motion. Neck supple.  Cardiovascular: Normal rate, regular rhythm and normal heart sounds.   Pulmonary/Chest: Effort normal and breath sounds normal. No respiratory distress.  Abdominal: Soft. Bowel sounds are normal. She exhibits no distension. There is no tenderness.  Body piercing at the umbilicus. Tattoos on the right side of the abdomen. No tenderness with deep palpation. No CVA tenderness.   Musculoskeletal: Normal range of motion.  Anterior ribs on the right are tender to palpation.   Lymphadenopathy:    She has no cervical adenopathy.  Neurological: She is  alert and oriented to person, place, and time.  Skin: Skin is warm and dry. There is erythema.  1 sonometer, slightly raised, erythematous area to the mid-lower abdomen.   Psychiatric: She has a normal mood and affect. Her behavior is normal.    ED Course  Procedures (including critical care time)  DIAGNOSTIC STUDIES: Oxygen Saturation is 100% on room air, normal by my interpretation.    COORDINATION OF CARE: 9:10 PM- Ordered UA and blood labs. Discussed treatment plan with patient at bedside and patient verbalized agreement.   Results for orders placed during the hospital encounter of 06/16/13  PREGNANCY, URINE      Result Value Range   Preg Test, Ur NEGATIVE  NEGATIVE  URINALYSIS, ROUTINE W REFLEX MICROSCOPIC      Result Value Range   Color, Urine YELLOW  YELLOW   APPearance CLOUDY (*) CLEAR   Specific Gravity, Urine 1.026  1.005 - 1.030   pH 6.5  5.0 - 8.0   Glucose, UA NEGATIVE  NEGATIVE mg/dL   Hgb urine dipstick NEGATIVE  NEGATIVE   Bilirubin Urine NEGATIVE  NEGATIVE   Ketones, ur NEGATIVE  NEGATIVE mg/dL   Protein, ur NEGATIVE  NEGATIVE mg/dL   Urobilinogen, UA 0.2  0.0 - 1.0 mg/dL   Nitrite NEGATIVE  NEGATIVE   Leukocytes, UA MODERATE (*) NEGATIVE  URINE MICROSCOPIC-ADD ON      Result Value Range   Squamous Epithelial / LPF MANY (*) RARE   WBC, UA 3-6  <3 WBC/hpf   Bacteria, UA MANY (*) RARE  CBC WITH DIFFERENTIAL      Result Value Range   WBC 12.3 (*) 4.0 - 10.5 K/uL   RBC 4.10  3.87 - 5.11 MIL/uL   Hemoglobin 11.3 (*) 12.0 - 15.0 g/dL   HCT 41.335.1 (*) 24.436.0 - 01.046.0 %   MCV 85.6  78.0 - 100.0 fL   MCH 27.6  26.0 - 34.0 pg   MCHC 32.2  30.0 - 36.0 g/dL   RDW 27.213.7  53.611.5 - 64.415.5 %   Platelets 320  150 - 400 K/uL   Neutrophils Relative % 69  43 - 77 %   Neutro Abs 8.5 (*) 1.7 - 7.7 K/uL   Lymphocytes Relative 25  12 - 46 %   Lymphs Abs 3.1  0.7 - 4.0 K/uL   Monocytes Relative 5  3 - 12 %   Monocytes Absolute 0.6  0.1 - 1.0 K/uL   Eosinophils Relative 1  0 - 5 %    Eosinophils Absolute 0.1  0.0 - 0.7 K/uL   Basophils Relative 0  0 - 1 %   Basophils Absolute 0.0  0.0 - 0.1 K/uL  BASIC METABOLIC PANEL      Result Value Range   Sodium 143  137 - 147 mEq/L   Potassium 4.2  3.7 -  5.3 mEq/L   Chloride 103  96 - 112 mEq/L   CO2 26  19 - 32 mEq/L   Glucose, Bld 110 (*) 70 - 99 mg/dL   BUN 16  6 - 23 mg/dL   Creatinine, Ser 1.30  0.50 - 1.10 mg/dL   Calcium 9.3  8.4 - 86.5 mg/dL   GFR calc non Af Amer >90  >90 mL/min   GFR calc Af Amer >90  >90 mL/min  @ 2315 patient re examined. No abdominal tenderness with deep palpation. Minimal tenderness right anterior ribs.   28 y.o. female with hx of UTI and abdominal pain. Currently being treated for UTI.  No pain at this time. I have reviewed this patient's vital signs, nurses notes, appropriate labs and imaging.  I have discussed in detail findings and plan of care. She will return to her PCP if the symptoms return or return here as needed.  MDM: I discussed this case with Dr. Fonnie Jarvis.   I personally performed the services described in this documentation, which was scribed in my presence. The recorded information has been reviewed and is accurate.       Janne Napoleon, [place] 06/17/13 1308

## 2013-06-16 NOTE — ED Notes (Signed)
Pt c/o right side back pain radiating to right ribs x 1.5 weeks. Today c/o right side abd pain and nausea

## 2013-06-16 NOTE — Discharge Instructions (Signed)
We have reviewed your blood work and urine and do not find a reason for the abdominal pain that you had earlier. Your rib pain may be from the coughing you had with your pneumonia. You will need to follow up with your doctor for re check of your pain. Return here as needed for worsening symptoms.    Abdominal Pain, Women Abdominal (stomach, pelvic, or belly) pain can be caused by many things. It is important to tell your doctor:  The location of the pain.  Does it come and go or is it present all the time?  Are there things that start the pain (eating certain foods, exercise)?  Are there other symptoms associated with the pain (fever, nausea, vomiting, diarrhea)? All of this is helpful to know when trying to find the cause of the pain. CAUSES   Stomach: virus or bacteria infection, or ulcer.  Intestine: appendicitis (inflamed appendix), regional ileitis (Crohn's disease), ulcerative colitis (inflamed colon), irritable bowel syndrome, diverticulitis (inflamed diverticulum of the colon), or cancer of the stomach or intestine.  Gallbladder disease or stones in the gallbladder.  Kidney disease, kidney stones, or infection.  Pancreas infection or cancer.  Fibromyalgia (pain disorder).  Diseases of the female organs:  Uterus: fibroid (non-cancerous) tumors or infection.  Fallopian tubes: infection or tubal pregnancy.  Ovary: cysts or tumors.  Pelvic adhesions (scar tissue).  Endometriosis (uterus lining tissue growing in the pelvis and on the pelvic organs).  Pelvic congestion syndrome (female organs filling up with blood just before the menstrual period).  Pain with the menstrual period.  Pain with ovulation (producing an egg).  Pain with an IUD (intrauterine device, birth control) in the uterus.  Cancer of the female organs.  Functional pain (pain not caused by a disease, may improve without treatment).  Psychological pain.  Depression. DIAGNOSIS  Your doctor will  decide the seriousness of your pain by doing an examination.  Blood tests.  X-rays.  Ultrasound.  CT scan (computed tomography, special type of X-ray).  MRI (magnetic resonance imaging).  Cultures, for infection.  Barium enema (dye inserted in the large intestine, to better view it with X-rays).  Colonoscopy (looking in intestine with a lighted tube).  Laparoscopy (minor surgery, looking in abdomen with a lighted tube).  Major abdominal exploratory surgery (looking in abdomen with a large incision). TREATMENT  The treatment will depend on the cause of the pain.   Many cases can be observed and treated at home.  Over-the-counter medicines recommended by your caregiver.  Prescription medicine.  Antibiotics, for infection.  Birth control pills, for painful periods or for ovulation pain.  Hormone treatment, for endometriosis.  Nerve blocking injections.  Physical therapy.  Antidepressants.  Counseling with a psychologist or psychiatrist.  Minor or major surgery. HOME CARE INSTRUCTIONS   Do not take laxatives, unless directed by your caregiver.  Take over-the-counter pain medicine only if ordered by your caregiver. Do not take aspirin because it can cause an upset stomach or bleeding.  Try a clear liquid diet (broth or water) as ordered by your caregiver. Slowly move to a bland diet, as tolerated, if the pain is related to the stomach or intestine.  Have a thermometer and take your temperature several times a day, and record it.  Bed rest and sleep, if it helps the pain.  Avoid sexual intercourse, if it causes pain.  Avoid stressful situations.  Keep your follow-up appointments and tests, as your caregiver orders.  If the pain does not go  away with medicine or surgery, you may try:  Acupuncture.  Relaxation exercises (yoga, meditation).  Group therapy.  Counseling. SEEK MEDICAL CARE IF:   You notice certain foods cause stomach pain.  Your home  care treatment is not helping your pain.  You need stronger pain medicine.  You want your IUD removed.  You feel faint or lightheaded.  You develop nausea and vomiting.  You develop a rash.  You are having side effects or an allergy to your medicine. SEEK IMMEDIATE MEDICAL CARE IF:   Your pain does not go away or gets worse.  You have a fever.  Your pain is felt only in portions of the abdomen. The right side could possibly be appendicitis. The left lower portion of the abdomen could be colitis or diverticulitis.  You are passing blood in your stools (bright red or black tarry stools, with or without vomiting).  You have blood in your urine.  You develop chills, with or without a fever.  You pass out. MAKE SURE YOU:   Understand these instructions.  Will watch your condition.  Will get help right away if you are not doing well or get worse. Document Released: 03/20/2007 Document Revised: 08/15/2011 Document Reviewed: 04/09/2009 [facility] Patient Information 2014 [place], [place].

## 2013-06-16 NOTE — ED Notes (Signed)
Patient drove herself here.Marland Kitchen.Marland Kitchen.Marland Kitchen.Percocet held.

## 2013-06-16 NOTE — ED Notes (Signed)
Has right CVA tenderness

## 2013-06-17 NOTE — ED Provider Notes (Signed)
Medical screening examination/treatment/procedure(s) were performed by non-physician practitioner and as supervising physician I was immediately available for consultation/collaboration.  EKG Interpretation   None        Hurman HornJohn M Oakland Fant, MD 06/17/13 2122

## 2013-06-18 LAB — URINE CULTURE: Colony Count: 50000

## 2013-07-17 IMAGING — US US OB TRANSVAGINAL
1 series · 13 of 28 positions shown · non-contrast
Comparison: None.

CLINICAL DATA: Pelvic cramping and pain.

OBSTETRIC <14 WK US AND TRANSVAGINAL OB US
TECHNIQUE: Both transabdominal and transvaginal ultrasound
examinations were performed for complete evaluation of the
gestation as well as the maternal uterus, adnexal regions, and
pelvic cul-de-sac.  Transvaginal technique was performed to assess
early pregnancy.

[Series 1: us ob transvaginal · 43 acquisitions, 13 frames shown]
[im 2/43]
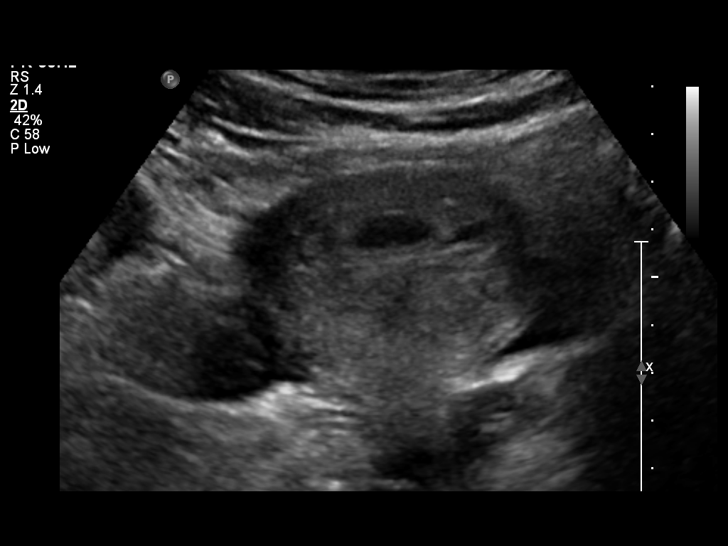
[im 5/43]
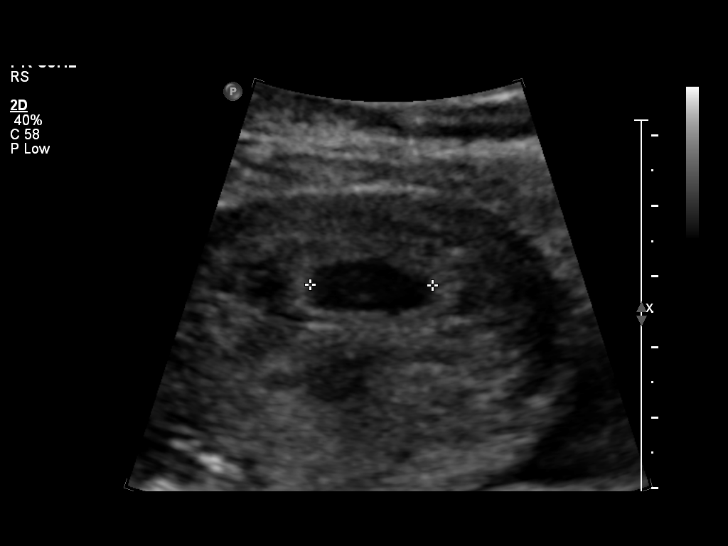
[im 8/43]
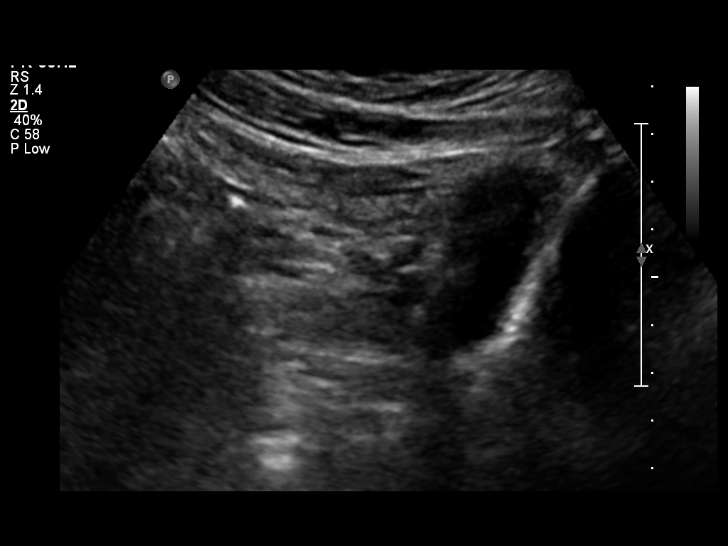
[im 11/43]
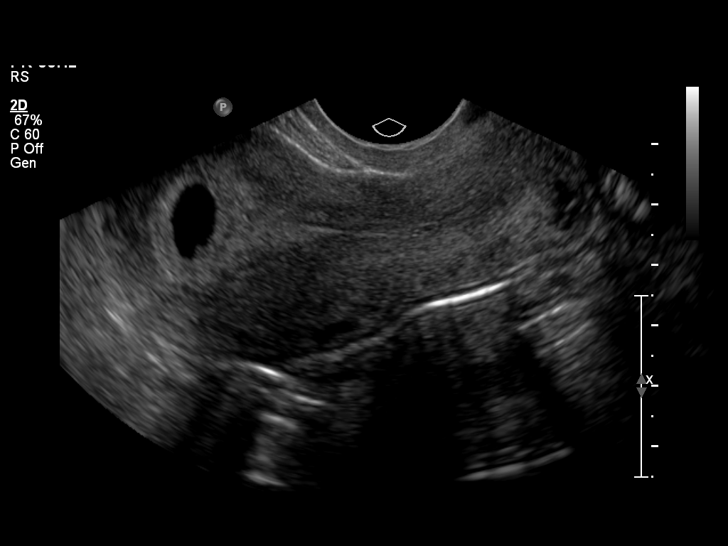
[im 15/43]
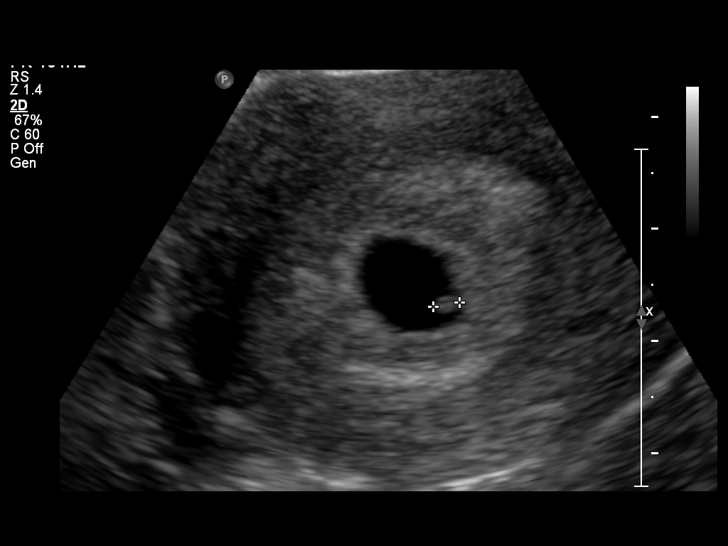
[im 18/43]
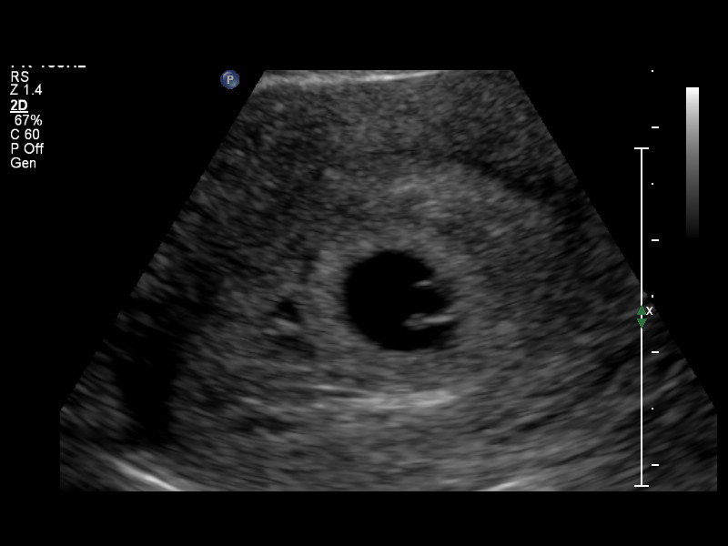
[im 22/43]
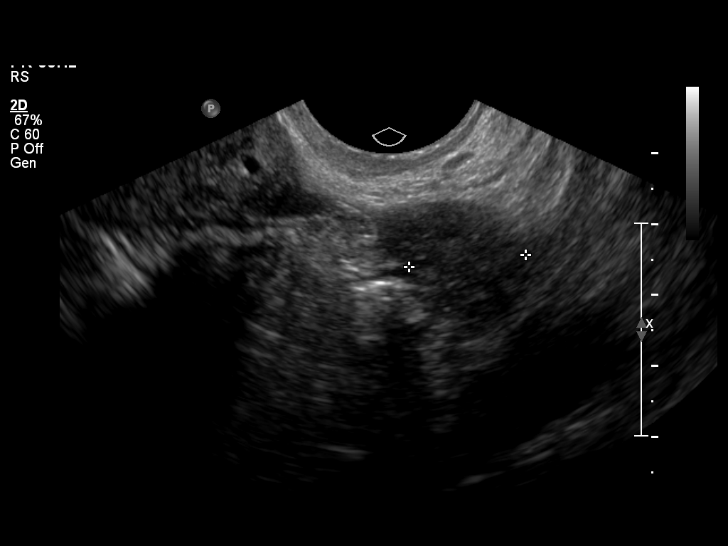
[im 25/43]
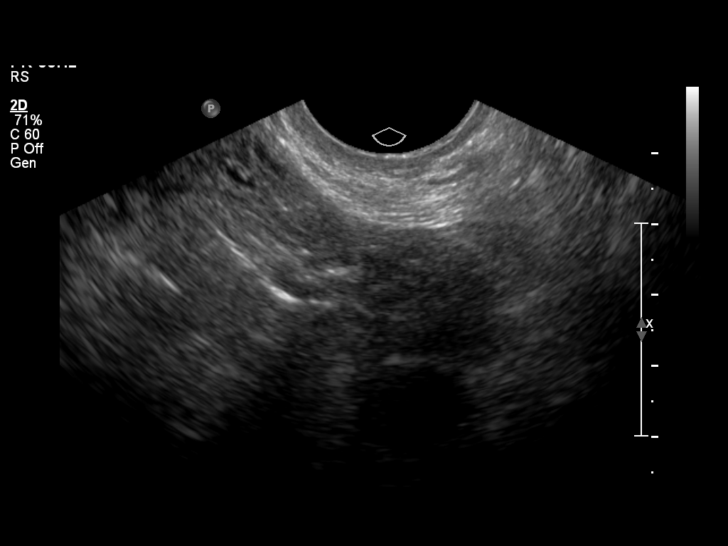
[im 29/43]
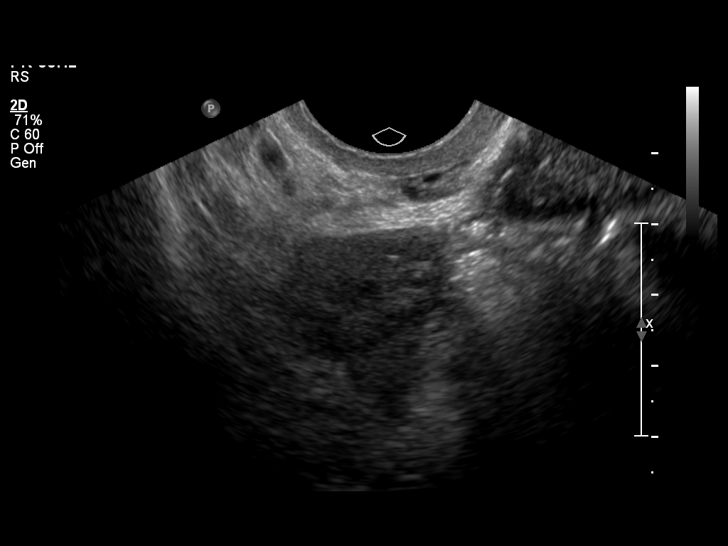
[im 32/43]
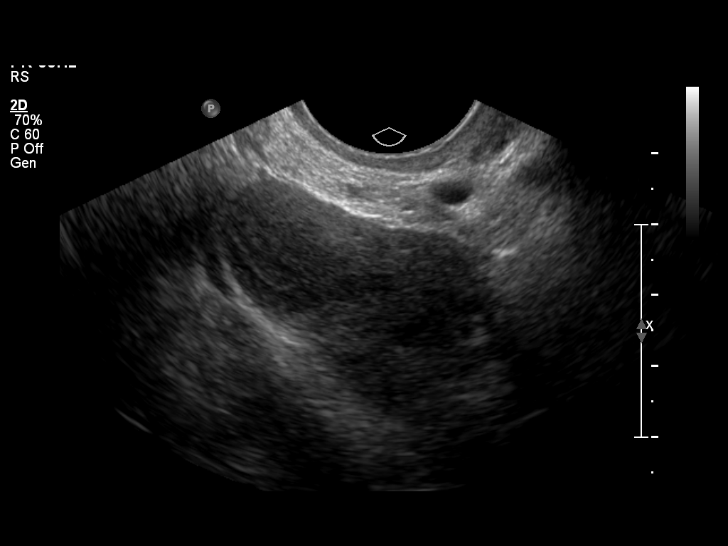
[im 35/43]
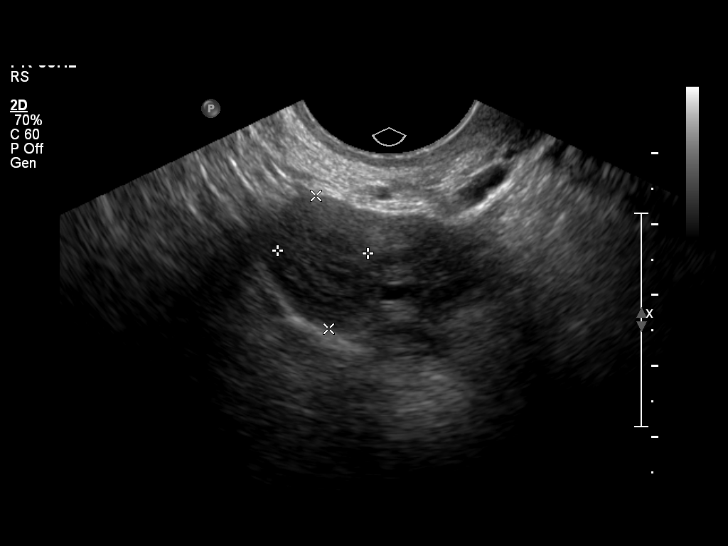
[im 38/43]
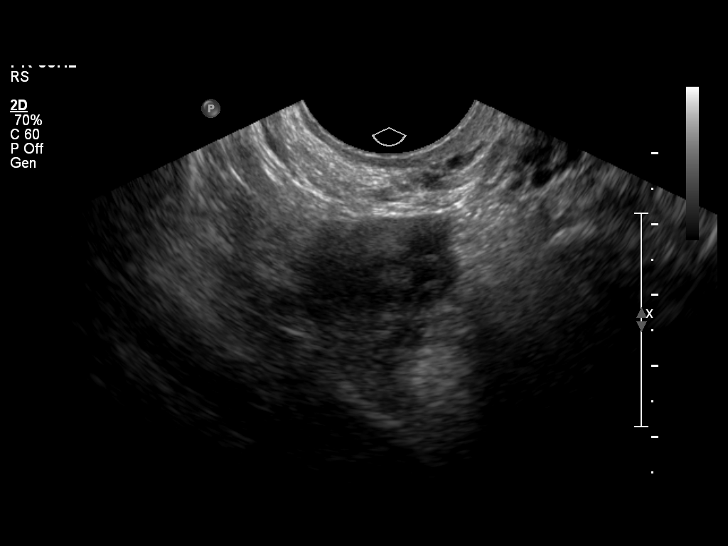
[im 41/43]
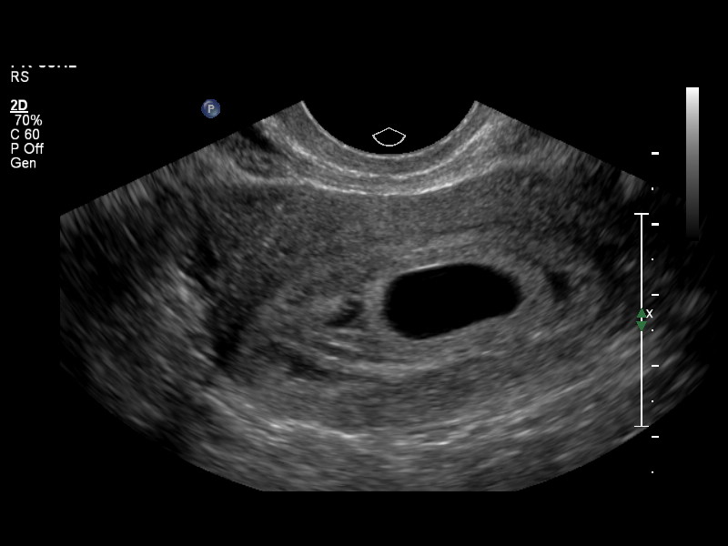

[13 of 28 positions shown; findings below may reference images not displayed]

Intrauterine gestational sac:  Visualized/normal in shape.
Yolk sac: Yes
Embryo: Yes
Cardiac Activity: Yes
Heart Rate: 117 bpm

CRL: 2.2   mm  5   w  6   d         US EDC: 10/23/2011

Maternal uterus/adnexae:
A small amount of subchorionic hemorrhage is noted.  The uterus is
otherwise unremarkable in appearance.

The ovaries are unremarkable in appearance.  The right ovary
measures 3.2 x 1.8 x 2.2 cm, while the left ovary measures 2.0 x
1.5 x 1.7 cm.  No suspicious adnexal masses are seen.  There is no
evidence for ovarian torsion.

No free fluid is seen within the pelvic cul-de-sac.
IMPRESSION: 1.  Single live intrauterine pregnancy, with a crown-rump length of
2.2 mm, corresponding to a gestational age of 5 weeks 6 days.  This
reflects an estimated date of delivery October 23, 2011.
2.  Small amount of subchorionic hemorrhage noted.

## 2013-09-22 ENCOUNTER — Encounter (HOSPITAL_BASED_OUTPATIENT_CLINIC_OR_DEPARTMENT_OTHER): Payer: Self-pay | Admitting: Emergency Medicine

## 2013-09-22 ENCOUNTER — Emergency Department (HOSPITAL_BASED_OUTPATIENT_CLINIC_OR_DEPARTMENT_OTHER)
Admission: EM | Admit: 2013-09-22 | Discharge: 2013-09-22 | Disposition: A | Discharge status: Home / Self Care | Payer: Medicaid Other | Attending: Emergency Medicine | Admitting: Emergency Medicine

## 2013-09-22 DIAGNOSIS — H5789 Other specified disorders of eye and adnexa: Secondary | ICD-10-CM | POA: Insufficient documentation

## 2013-09-22 DIAGNOSIS — J029 Acute pharyngitis, unspecified: Secondary | ICD-10-CM | POA: Insufficient documentation

## 2013-09-22 DIAGNOSIS — M549 Dorsalgia, unspecified: Secondary | ICD-10-CM | POA: Insufficient documentation

## 2013-09-22 DIAGNOSIS — H6690 Otitis media, unspecified, unspecified ear: Secondary | ICD-10-CM | POA: Insufficient documentation

## 2013-09-22 DIAGNOSIS — Z87442 Personal history of urinary calculi: Secondary | ICD-10-CM | POA: Insufficient documentation

## 2013-09-22 DIAGNOSIS — R000 Tachycardia, unspecified: Secondary | ICD-10-CM | POA: Insufficient documentation

## 2013-09-22 DIAGNOSIS — Z8619 Personal history of other infectious and parasitic diseases: Secondary | ICD-10-CM | POA: Insufficient documentation

## 2013-09-22 DIAGNOSIS — M542 Cervicalgia: Secondary | ICD-10-CM | POA: Insufficient documentation

## 2013-09-22 DIAGNOSIS — H6691 Otitis media, unspecified, right ear: Secondary | ICD-10-CM

## 2013-09-22 DIAGNOSIS — Z87891 Personal history of nicotine dependence: Secondary | ICD-10-CM | POA: Insufficient documentation

## 2013-09-22 LAB — RAPID STREP SCREEN (MED CTR MEBANE ONLY): Streptococcus, Group A Screen (Direct): NEGATIVE

## 2013-09-22 MED ORDER — ACETAMINOPHEN 500 MG PO TABS
1000.0000 mg | ORAL_TABLET | Freq: Once | ORAL | Status: AC
  Administered 2013-09-22: 1000 mg via ORAL
  Filled 2013-09-22: qty 2

## 2013-09-22 MED ORDER — AMOXICILLIN 500 MG PO CAPS: 500.0000 mg | ORAL_CAPSULE | Freq: Three times a day (TID) | ORAL | Status: DC

## 2013-09-22 MED ORDER — ACETAMINOPHEN 500 MG PO TABS
ORAL_TABLET | ORAL | Status: AC
  Filled 2013-09-22: qty 2

## 2013-09-22 NOTE — Discharge Instructions (Signed)
You will need to follow up with your doctor next week to be sure the the ear infection has resolved. Return here as needed for worsening symptoms. Take tylenol and ibuprofen as needed for pain or fever. Take the antibiotic as directed.

## 2013-09-22 NOTE — ED Notes (Signed)
NP at bedside.

## 2013-09-22 NOTE — ED Provider Notes (Signed)
CSN: [id number]     Arrival date & time 09/22/13  2121 History   First MD Initiated Contact with Patient 09/22/13 2126     Chief Complaint  Patient presents with  . Otalgia  . Sore Throat     (Consider location/radiation/quality/duration/timing/severity/associated sxs/prior Treatment) Patient is a 28 y.o. female presenting with ear pain and pharyngitis. The history is provided by the patient.  Otalgia Location:  Bilateral Quality:  Aching Severity:  Moderate Onset quality:  Gradual Duration:  3 days Timing:  Constant Progression:  Worsening Chronicity:  New Relieved by:  Nothing Associated symptoms: congestion, neck pain, rhinorrhea and sore throat   Associated symptoms: no abdominal pain, no cough, no headaches, no rash and no vomiting   Sore Throat Associated symptoms include congestion, neck pain and a sore throat. Pertinent negatives include no abdominal pain, chest pain, coughing, headaches, nausea, rash or vomiting.   Breck CoonsLisa Covello is a 28 y.o. female who presents to the ED with bilateral ear pain, sore throat, nasal congestion and itchy eyes that started 3 days ago. She has had fever and chills and feels achy all over. Took OTC decongestant earlier today.   Past Medical History  Diagnosis Date  . Headache(784.0)   . Kidney calculi     stones x2 in 2011  . Chlamydia   . Kidney stones    Past Surgical History  Procedure Laterality Date  . Anterior cruciate ligament repair      right knee  . Right knee surgery     No family history on file. History  Substance Use Topics  . Smoking status: Former Smoker -- 1.00 packs/day    Quit date: 02/25/2011  . Smokeless tobacco: Never Used  . Alcohol Use: No   OB History   Grav Para Term Preterm Abortions TAB SAB Ect Mult Living   1 0 0 0 0 0 0 0 0 0      Review of Systems  HENT: Positive for congestion, ear pain, rhinorrhea and sore throat.   Eyes: Positive for redness and itching.  Respiratory: Negative for cough and  shortness of breath.   Cardiovascular: Negative for chest pain.  Gastrointestinal: Negative for nausea, vomiting and abdominal pain.  Genitourinary: Negative for dysuria, urgency, frequency and decreased urine volume.  Musculoskeletal: Positive for back pain and neck pain. Negative for neck stiffness.  Skin: Negative for rash.  Neurological: Negative for dizziness, syncope and headaches.  Psychiatric/Behavioral: Negative for confusion. The patient is not nervous/anxious.       Allergies  Review of patient's allergies indicates no known allergies.  Home Medications   Prior to Admission medications   Medication Sig Start Date End Date Taking? Authorizing Provider  acetaminophen (TYLENOL) 500 MG tablet Take 1,000 mg by mouth every 6 (six) hours as needed. For pain or headache    Historical Provider, MD  azithromycin (ZITHROMAX) 250 MG tablet 1 every day until finished. 05/14/13   Teressa LowerVrinda Pickering, NP   BP 162/84  Pulse 125  Temp(Src) 100.2 F (37.9 C) (Oral)  Resp 18  Ht 5\' 9"  (1.753 m)  Wt 280 lb (127.007 kg)  BMI 41.33 kg/m2  SpO2 98% Physical Exam  Nursing note and vitals reviewed. Constitutional: She is oriented to person, place, and time. She appears well-developed and well-nourished. No distress.  HENT:  Head: Normocephalic.  Right Ear: Tympanic membrane normal.  Left Ear: Tympanic membrane is erythematous.  Nose: Rhinorrhea present.  Mouth/Throat: Uvula is midline and mucous membranes are normal. Posterior oropharyngeal  erythema present.  Eyes: EOM are normal. Pupils are equal, round, and reactive to light. Right conjunctiva is injected. Left conjunctiva is injected.  Neck: Trachea normal and normal range of motion. Neck supple.  Full range of motion of neck without pain. Can touch chin to chest without neck pain.   Cardiovascular: Tachycardia present.   Pulmonary/Chest: Effort normal. She has no wheezes. She has no rales.  Abdominal: Soft. Bowel sounds are normal.  There is no tenderness.  Musculoskeletal: Normal range of motion.  Lymphadenopathy:    She has cervical adenopathy.  Neurological: She is alert and oriented to person, place, and time. No cranial nerve deficit.  Skin: Skin is warm and dry.  Psychiatric: She has a normal mood and affect. Her behavior is normal.    ED Course: re examined after Tylenol. Heart rate normal, no wheezing, no rales, abdomen soft with normal bowel sounds. No neck pain or meningeal signs.   Procedures  Results for orders placed during the hospital encounter of 09/22/13 (from the past 24 hour(s))  RAPID STREP SCREEN     Status: None   Collection Time    09/22/13  9:35 PM      Result Value Ref Range   Streptococcus, Group A Screen (Direct) NEGATIVE  NEGATIVE    MDM  28 y.o. female with sore throat and ear pain x 3 days with URI symptoms. Will treat for otitis media. Stable for discharge without meningeal signs. Full range of motion of neck without pain. No pain with palpation over cervical spine area. Patient will return for worsening symptoms.   BP 144/75  Pulse 93  Temp(Src) 100.2 F (37.9 C) (Oral)  Resp 22  Ht 5\' 9"  (1.753 m)  Wt 280 lb (127.007 kg)  BMI 41.33 kg/m2  SpO2 100%  LMP 09/08/2013     Janne NapoleonHope M Judiann Celia, NP 09/22/13 2251

## 2013-09-22 NOTE — ED Notes (Signed)
Rx x 1 given for amoxicillin-  

## 2013-09-22 NOTE — ED Notes (Signed)
Pt reports onset of s/s 3 days ago - pt c/o bilateral ear pressure and pain, sore throat, painful to swallow, itchy eyes, mild intermittent nasal congestion, and neck pain that radiates down into back.

## 2013-09-24 NOTE — ED Provider Notes (Signed)
History/physical exam/procedure(s) were performed by non-physician practitioner and as supervising physician I was immediately available for consultation/collaboration. I have reviewed all notes and am in agreement with care and plan.   Halina Asano S Jacobe Study, MD 09/24/13 1529 

## 2013-09-25 LAB — CULTURE, GROUP A STREP

## 2014-04-07 ENCOUNTER — Encounter (HOSPITAL_BASED_OUTPATIENT_CLINIC_OR_DEPARTMENT_OTHER): Payer: Self-pay | Admitting: Emergency Medicine

## 2014-07-18 ENCOUNTER — Emergency Department (HOSPITAL_BASED_OUTPATIENT_CLINIC_OR_DEPARTMENT_OTHER)
Admission: EM | Admit: 2014-07-18 | Discharge: 2014-07-18 | Disposition: A | Discharge status: Home / Self Care | Payer: BLUE CROSS/BLUE SHIELD | Attending: Emergency Medicine | Admitting: Emergency Medicine

## 2014-07-18 ENCOUNTER — Encounter (HOSPITAL_BASED_OUTPATIENT_CLINIC_OR_DEPARTMENT_OTHER): Payer: Self-pay | Admitting: *Deleted

## 2014-07-18 ENCOUNTER — Emergency Department (HOSPITAL_BASED_OUTPATIENT_CLINIC_OR_DEPARTMENT_OTHER): Payer: BLUE CROSS/BLUE SHIELD

## 2014-07-18 DIAGNOSIS — Z87891 Personal history of nicotine dependence: Secondary | ICD-10-CM | POA: Diagnosis not present

## 2014-07-18 DIAGNOSIS — J040 Acute laryngitis: Secondary | ICD-10-CM

## 2014-07-18 DIAGNOSIS — J069 Acute upper respiratory infection, unspecified: Secondary | ICD-10-CM | POA: Diagnosis not present

## 2014-07-18 DIAGNOSIS — Z87442 Personal history of urinary calculi: Secondary | ICD-10-CM | POA: Insufficient documentation

## 2014-07-18 DIAGNOSIS — R05 Cough: Secondary | ICD-10-CM | POA: Diagnosis present

## 2014-07-18 DIAGNOSIS — Z8619 Personal history of other infectious and parasitic diseases: Secondary | ICD-10-CM | POA: Diagnosis not present

## 2014-07-18 MED ORDER — IBUPROFEN 800 MG PO TABS: 800.0000 mg | ORAL_TABLET | Freq: Three times a day (TID) | ORAL | Status: AC

## 2014-07-18 MED ORDER — MAGIC MOUTHWASH: 5.0000 mL | Freq: Three times a day (TID) | ORAL | Status: AC | PRN

## 2014-07-18 NOTE — Discharge Instructions (Signed)
Laryngitis °At the top of your windpipe is your voice box. It is the source of your voice. Inside your voice box are 2 bands of muscles called vocal cords. When you breathe, your vocal cords are relaxed and open so that air can get into the lungs. When you decide to say something, these cords come together and vibrate. The sound from these vibrations goes into your throat and comes out through your mouth as sound.  °Laryngitis is an inflammation of the vocal cords that causes hoarseness, cough, loss of voice, sore throat, and dry throat. Laryngitis can be temporary (acute) or long-term (chronic). Most cases of acute laryngitis improve with time.Chronic laryngitis lasts for more than 3 weeks. °CAUSES °Laryngitis can often be related to excessive smoking, talking, or yelling, as well as inhalation of toxic fumes and allergies. Acute laryngitis is usually caused by a viral infection, vocal strain, measles or mumps, or bacterial infections. Chronic laryngitis is usually caused by vocal cord strain, vocal cord injury, postnasal drip, growths on the vocal cords, or acid reflux. °SYMPTOMS  °· Cough. °· Sore throat. °· Dry throat. °RISK FACTORS °· Respiratory infections. °· Exposure to irritating substances, such as cigarette smoke, excessive amounts of alcohol, stomach acids, and workplace chemicals. °· Voice trauma, such as vocal cord injury from shouting or speaking too loud. °DIAGNOSIS  °Your cargiver will perform a physical exam. During the physical exam, your caregiver will examine your throat. The most common sign of laryngitis is hoarseness. Laryngoscopy may be necessary to confirm the diagnosis of this condition. This procedure allows your caregiver to look into the larynx. °HOME CARE INSTRUCTIONS °· Drink enough fluids to keep your urine clear or pale yellow. °· Rest until you no longer have symptoms or as directed by your caregiver. °· Breathe in moist air. °· Take all medicine as directed by your  caregiver. °· Do not smoke. °· Talk as little as possible (this includes whispering). °· Write on paper instead of talking until your voice is back to normal. °· Follow up with your caregiver if your condition has not improved after 10 days. °SEEK MEDICAL CARE IF:  °· You have trouble breathing. °· You cough up blood. °· You have persistent fever. °· You have increasing pain. °· You have difficulty swallowing. °MAKE SURE YOU: °· Understand these instructions. °· Will watch your condition. °· Will get help right away if you are not doing well or get worse. °Document Released: 05/23/2005 Document Revised: 08/15/2011 Document Reviewed: 07/29/2010 °[employer]® Patient Information ©2015 [employer]. This information is not intended to replace advice given to you by your health care provider. Make sure you discuss any questions you have with your health care provider. ° °

## 2014-07-18 NOTE — ED Provider Notes (Signed)
CSN: [id number]     Arrival date & time 07/18/14  2220 History  This chart was scribed for Melinda [name] Melinda [name] Melinda Poplaski-Rasch, MD by Melinda [name], ED Scribe. This patient was seen in room MH03/MH03 and the patient's care was started at 11:05 PM.    Chief Complaint  Patient presents with  . URI   Patient is a 29 y.o. female presenting with URI. The history is provided by the patient. No language interpreter was used.  URI Presenting symptoms: congestion, cough and sore throat (Minimal soreness; patient hoarse)   Presenting symptoms: no ear pain and no fever   Cough:    Cough characteristics:  Productive   Sputum characteristics:  Nondescript   Severity:  Mild   Onset quality:  Gradual   Duration:  1 week   Timing:  Intermittent   Progression:  Improving   Chronicity:  New Severity:  Unable to specify Onset quality:  Gradual Duration:  5 days Timing:  Intermittent Progression:  Resolved Chronicity:  New Relieved by:  Nothing Worsened by:  Nothing tried Ineffective treatments:  None tried Associated symptoms: no arthralgias, no neck pain and no wheezing   Risk factors: not elderly      HPI Comments: Melinda [name] is a 29 y.o. female who presents to the Emergency Department complaining of 1 day of hoarseness. She explains that she has been feeling unwell for the last 7 days  No PCP at this time.   Past Medical History  Diagnosis Date  . Headache(784.0)   . Kidney calculi     stones x2 in 2011  . Chlamydia   . Kidney stones    Past Surgical History  Procedure Laterality Date  . Anterior cruciate ligament repair      right knee  . Right knee surgery     History reviewed. No pertinent family history. History  Substance Use Topics  . Smoking status: Former Smoker -- 1.00 packs/day    Quit date: 02/25/2011  . Smokeless tobacco: Never Used  . Alcohol Use: No   OB History    Gravida Para Term Preterm AB TAB SAB Ectopic Multiple Living   1 0 0 0 0 0 0 0 0 0      Review of Systems   Constitutional: Negative for fever, chills and diaphoresis.  HENT: Positive for congestion and sore throat (Minimal soreness; patient hoarse). Negative for drooling, ear pain, facial swelling, trouble swallowing and voice change.   Respiratory: Positive for cough. Negative for shortness of breath and wheezing.   Musculoskeletal: Negative for arthralgias and neck pain.  All other systems reviewed and are negative.  Allergies  Review of patient's allergies indicates no known allergies.  Home Medications   Prior to Admission medications   Not on File   BP 141/92 mmHg  Pulse 91  Temp(Src) 98.3 F (36.8 C) (Oral)  Resp 18  Ht 5\' 9"  (1.753 m)  Wt 260 lb (117.935 kg)  BMI 38.38 kg/m2  SpO2 100%  LMP 07/18/2014 Physical Exam  Constitutional: She is oriented to person, place, and time. She appears well-developed and well-nourished. No distress.  HENT:  Head: Normocephalic and atraumatic.  Mouth/Throat: Oropharynx is clear and moist. No oropharyngeal exudate.  Moist mucous membranes. Left TM normal; right ear canal has minimal wax but TM normal.   Eyes: EOM are normal. Pupils are equal, round, and reactive to light.  Neck: Normal range of motion. Neck supple.  No pain with displacement of the trachea, mallempati class 1  Cardiovascular:  Normal rate, regular rhythm and normal heart sounds.  Exam reveals no gallop and no friction rub.   No murmur heard. Pulmonary/Chest: Effort normal. No respiratory distress. She has no wheezes. She has no rales.  No stridor  Abdominal: Soft. There is no tenderness. There is no rebound and no guarding.  Musculoskeletal: Normal range of motion. She exhibits no edema.  Lymphadenopathy:    She has no cervical adenopathy.  Neurological: She is alert and oriented to person, place, and time.  Skin: Skin is warm and dry. No rash noted.  Psychiatric: She has a normal mood and affect. Her behavior is normal.  Nursing note and vitals reviewed.   ED Course   Procedures   DIAGNOSTIC STUDIES: Oxygen Saturation is 100% on RA, normal by my interpretation.    COORDINATION OF CARE: 11:12 PM Discussed treatment plan with pt at bedside and pt agreed to plan.   Labs Review Labs Reviewed - No data to display  Imaging Review Dg Chest 2 View  07/18/2014   CLINICAL DATA:  Cough, sore throat for 6 days.  Fever for 2 days.  EXAM: CHEST  2 VIEW  COMPARISON:  Chest radiograph May 14, 2013  FINDINGS: Cardiomediastinal silhouette is unremarkable. Mildly elevated RIGHT hemidiaphragm. The lungs are clear without pleural effusions or focal consolidations. Trachea projects midline and there is no pneumothorax. Soft tissue planes and included osseous structures are non-suspicious. Mild thoracolumbar kyphosis, unchanged.  IMPRESSION: No acute cardiopulmonary process, stable appearance of the chest from May 14, 2013.   Electronically Signed   By: Awilda Metro   On: 07/18/2014 22:55     EKG Interpretation None      MDM   Final diagnoses:  None  Based on Center criteria there is no indication for additional testing   URI with laryngitis.  Will prescribe magic mouthwash and recommend salt water gargles.  Follow up with your PMD for recheck Monday.    I personally performed the services described in this documentation, which was scribed in my presence. The recorded information has been reviewed and is accurate.       Jasmine Awe, MD 07/19/14 [phone]

## 2014-07-18 NOTE — ED Notes (Signed)
Last Saturday pt started feeling bad.  States that she had a fever until Wednesday.  Productive cough.  Pt hoarse.

## 2014-07-18 NOTE — ED Notes (Signed)
Pt discharged home with friend.  Pt in NAD and ambulatory at discharge.  Prescriptions x 2 (magic mouthwash and ibuprofen) given to patient.

## 2014-07-19 ENCOUNTER — Encounter (HOSPITAL_BASED_OUTPATIENT_CLINIC_OR_DEPARTMENT_OTHER): Payer: Self-pay | Admitting: Emergency Medicine

## 2015-04-21 ENCOUNTER — Emergency Department (HOSPITAL_BASED_OUTPATIENT_CLINIC_OR_DEPARTMENT_OTHER): Payer: BLUE CROSS/BLUE SHIELD

## 2015-04-21 ENCOUNTER — Encounter (HOSPITAL_BASED_OUTPATIENT_CLINIC_OR_DEPARTMENT_OTHER): Payer: Self-pay | Admitting: Emergency Medicine

## 2015-04-21 ENCOUNTER — Emergency Department (HOSPITAL_BASED_OUTPATIENT_CLINIC_OR_DEPARTMENT_OTHER)
Admission: EM | Admit: 2015-04-21 | Discharge: 2015-04-21 | Disposition: A | Discharge status: Home / Self Care | Payer: BLUE CROSS/BLUE SHIELD | Attending: Emergency Medicine | Admitting: Emergency Medicine

## 2015-04-21 DIAGNOSIS — J069 Acute upper respiratory infection, unspecified: Secondary | ICD-10-CM

## 2015-04-21 DIAGNOSIS — N390 Urinary tract infection, site not specified: Secondary | ICD-10-CM | POA: Insufficient documentation

## 2015-04-21 DIAGNOSIS — Z8619 Personal history of other infectious and parasitic diseases: Secondary | ICD-10-CM | POA: Insufficient documentation

## 2015-04-21 DIAGNOSIS — R05 Cough: Secondary | ICD-10-CM | POA: Diagnosis present

## 2015-04-21 DIAGNOSIS — Z87891 Personal history of nicotine dependence: Secondary | ICD-10-CM | POA: Diagnosis not present

## 2015-04-21 DIAGNOSIS — Z3202 Encounter for pregnancy test, result negative: Secondary | ICD-10-CM | POA: Diagnosis not present

## 2015-04-21 DIAGNOSIS — Z87442 Personal history of urinary calculi: Secondary | ICD-10-CM | POA: Diagnosis not present

## 2015-04-21 DIAGNOSIS — R059 Cough, unspecified: Secondary | ICD-10-CM

## 2015-04-21 DIAGNOSIS — Z791 Long term (current) use of non-steroidal anti-inflammatories (NSAID): Secondary | ICD-10-CM | POA: Insufficient documentation

## 2015-04-21 LAB — URINE MICROSCOPIC-ADD ON

## 2015-04-21 LAB — URINALYSIS, ROUTINE W REFLEX MICROSCOPIC
BILIRUBIN URINE: NEGATIVE
GLUCOSE, UA: NEGATIVE mg/dL
Hgb urine dipstick: NEGATIVE
Ketones, ur: NEGATIVE mg/dL
Nitrite: NEGATIVE
PH: 5.5 (ref 5.0–8.0)
Protein, ur: NEGATIVE mg/dL
Specific Gravity, Urine: 1.027 (ref 1.005–1.030)

## 2015-04-21 LAB — PREGNANCY, URINE: Preg Test, Ur: NEGATIVE

## 2015-04-21 MED ORDER — BENZONATATE 100 MG PO CAPS: 100.0000 mg | ORAL_CAPSULE | Freq: Three times a day (TID) | ORAL | Status: AC

## 2015-04-21 MED ORDER — FOSFOMYCIN TROMETHAMINE 3 G PO PACK
3.0000 g | PACK | Freq: Once | ORAL | Status: AC
  Administered 2015-04-21: 3 g via ORAL
  Filled 2015-04-21: qty 3

## 2015-04-21 NOTE — ED Notes (Signed)
Patient states that she has had a cough and cold x 1 -2 weeks. Today she reports that she get very tired very easily. The patient reports that she also may have a UTI

## 2015-04-21 NOTE — ED Provider Notes (Signed)
CSN: [id number]     Arrival date & time 04/21/15  1834 History  By signing my name below, I, Gwenyth Ober, attest that this documentation has been prepared under the direction and in the presence of Alvira Monday, MD.  Electronically Signed: Gwenyth Ober, ED Scribe. 04/21/2015. 10:08 PM.   Chief Complaint  Patient presents with  . Cough   Patient is a 29 y.o. female presenting with cough. The history is provided by the patient. No language interpreter was used.  Cough Cough characteristics:  Productive Sputum characteristics:  Yellow Severity:  Moderate Onset quality:  Gradual Duration:  2 weeks Timing:  Intermittent Progression:  Unchanged Chronicity:  New Relieved by: Nyquil and Dayquil. Ineffective treatments:  Decongestant Associated symptoms: chest pain and shortness of breath     HPI Comments: Melinda [name] is a 29 y.o. female who presents to the Emergency Department complaining of intermittent, moderate cough with yellow sputum that started 1.5 weeks ago. Pt states dull chest pain that occurs with cough, chest congestion, generalized  Fatigue and SOB that occurs with exertion as associated symptoms. She also notes sore throat and fever measuring 100 that are currently resolved. Pt tried Dayquil and Nyquil with some relief. She also tried Mucinex with no improvement in her symptoms. Pt denies estrogen use and a history of PE/DVT. She denies fever, sore throat, nausea, vomiting, leg swelling and back pain.  Pt also complains of intermittent urinary frequency and urgency that started 1 month ago. She denies vaginal discharge and vaginal bleeding.   Past Medical History  Diagnosis Date  . Headache(784.0)   . Kidney calculi     stones x2 in 2011  . Chlamydia   . Kidney stones    Past Surgical History  Procedure Laterality Date  . Anterior cruciate ligament repair      right knee  . Right knee surgery     History reviewed. No pertinent family history. Social History   Substance Use Topics  . Smoking status: Former Smoker -- 1.00 packs/day    Quit date: 02/25/2011  . Smokeless tobacco: Never Used  . Alcohol Use: No   OB History    Gravida Para Term Preterm AB TAB SAB Ectopic Multiple Living       Review of Systems  Constitutional: Positive for fatigue.  HENT: Positive for congestion.   Respiratory: Positive for cough and shortness of breath.   Cardiovascular: Positive for chest pain.  All other systems reviewed and are negative.     Allergies  Review of patient's allergies indicates no known allergies.  Home Medications   Prior to Admission medications   Medication Sig Start Date End Date Taking? Authorizing Provider  Alum & Mag Hydroxide-Simeth (MAGIC MOUTHWASH) SOLN Take 5 mLs by mouth 3 (three) times daily as needed for mouth pain. 07/18/14   April Palumbo, MD  benzonatate (TESSALON) 100 MG capsule Take 1 capsule (100 mg total) by mouth every 8 (eight) hours. 04/21/15   Alvira Monday, MD  ibuprofen (ADVIL,MOTRIN) 800 MG tablet Take 1 tablet (800 mg total) by mouth 3 (three) times daily. 07/18/14   April Palumbo, MD   BP 138/78 mmHg  Pulse 98  Temp(Src) 98.1 F (36.7 C) (Oral)  Resp 18  Ht  (1.753 m)  Wt 278 lb (126.1 kg)  BMI 41.03 kg/m2  SpO2 98%  LMP 03/22/2015 Physical Exam  Constitutional: She appears well-developed and well-nourished. No distress.  HENT:  Head:  Normocephalic and atraumatic.  Eyes: Conjunctivae and EOM are normal. Pupils are equal, round, and reactive to light.  Neck: Normal range of motion. Neck supple. No tracheal deviation present.  Cardiovascular: Normal rate, regular rhythm and normal heart sounds.   Pulmonary/Chest: Effort normal and breath sounds normal. No respiratory distress. She has no wheezes. She has no rales. She exhibits no tenderness.  Abdominal: Soft. There is no tenderness.  Musculoskeletal: She exhibits no tenderness.  Skin: Skin is warm and dry.  Psychiatric:  She has a normal mood and affect. Her behavior is normal.  Nursing note and vitals reviewed.   ED Course  Procedures  DIAGNOSTIC STUDIES: Oxygen Saturation is 99% on RA, normal by my interpretation.    COORDINATION OF CARE: 10:07 PM Discussed lab and imaging results with pt. Discussed treatment plan which includes fosfomycin in the ED and Tessalon Pearls. She agreed to plan.   Labs Review Labs Reviewed  URINALYSIS, ROUTINE W REFLEX MICROSCOPIC [facility]) - Abnormal; Notable for the following:    APPearance CLOUDY (*)    Leukocytes, UA MODERATE (*)    All other components within normal limits  URINE MICROSCOPIC-ADD ON - Abnormal; Notable for the following:    Squamous Epithelial / LPF 6-30 (*)    Bacteria, UA FEW (*)    All other components within normal limits  PREGNANCY, URINE    Imaging Review Dg Chest 2 View  04/21/2015  CLINICAL DATA:  Cough and wheezing. EXAM: CHEST  2 VIEW COMPARISON:  07/18/2014 chest radiograph. FINDINGS: Stable cardiomediastinal silhouette with normal heart size. No pneumothorax. No pleural effusion. Clear lungs, with no focal lung consolidation and no pulmonary edema. IMPRESSION: No active cardiopulmonary disease. Electronically Signed   By: Delbert PhenixJason A Poff M.D.   On: 04/21/2015 20:16   I have personally reviewed and evaluated these images and lab results as part of my medical decision-making.   EKG Interpretation None      MDM   Final diagnoses:  UTI (lower urinary tract infection)  Viral URI    29yo female with history of headache, nephrolithiasis presents with concern for cough, nasal congestion for 1-2 weeks and urinary symptoms for one month.  Chest XR shows no sign of pneumonia.  Urinalysis shows signs of UTI. Pt given rx for keflex, and given tessalon pearls for cough. Patient discharged in stable condition with understanding of reasons to return.   I personally performed the services described in this documentation, which was scribed  in my presence. The recorded information has been reviewed and is accurate.    Alvira MondayErin Anaysia Germer, MD 04/22/15 2112

## 2016-12-06 IMAGING — DX DG CHEST 2V
2 series · 2 of 2 positions shown · non-contrast
Comparison: Chest radiograph May 14, 2013

CLINICAL DATA: Cough, sore throat for 6 days.  Fever for 2 days.

EXAM:
CHEST  2 VIEW

[chest pa]
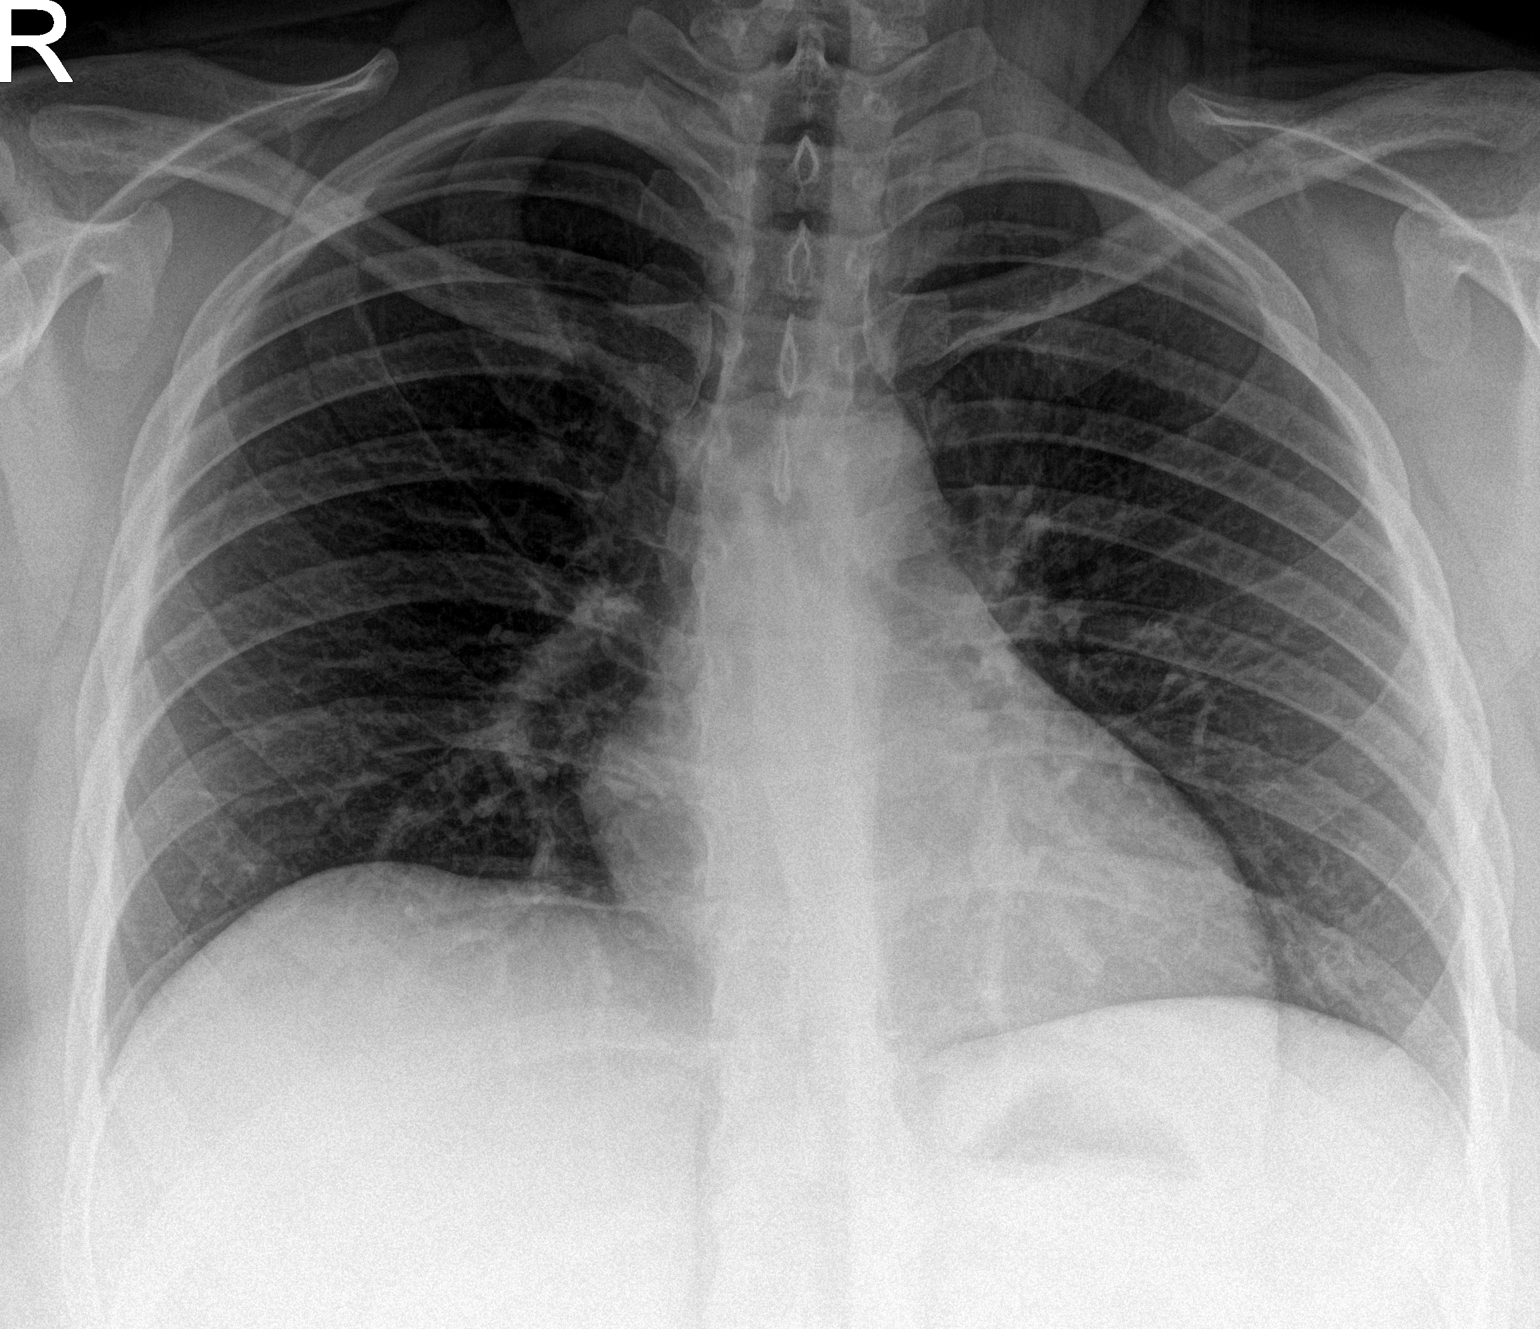

[chest lat]
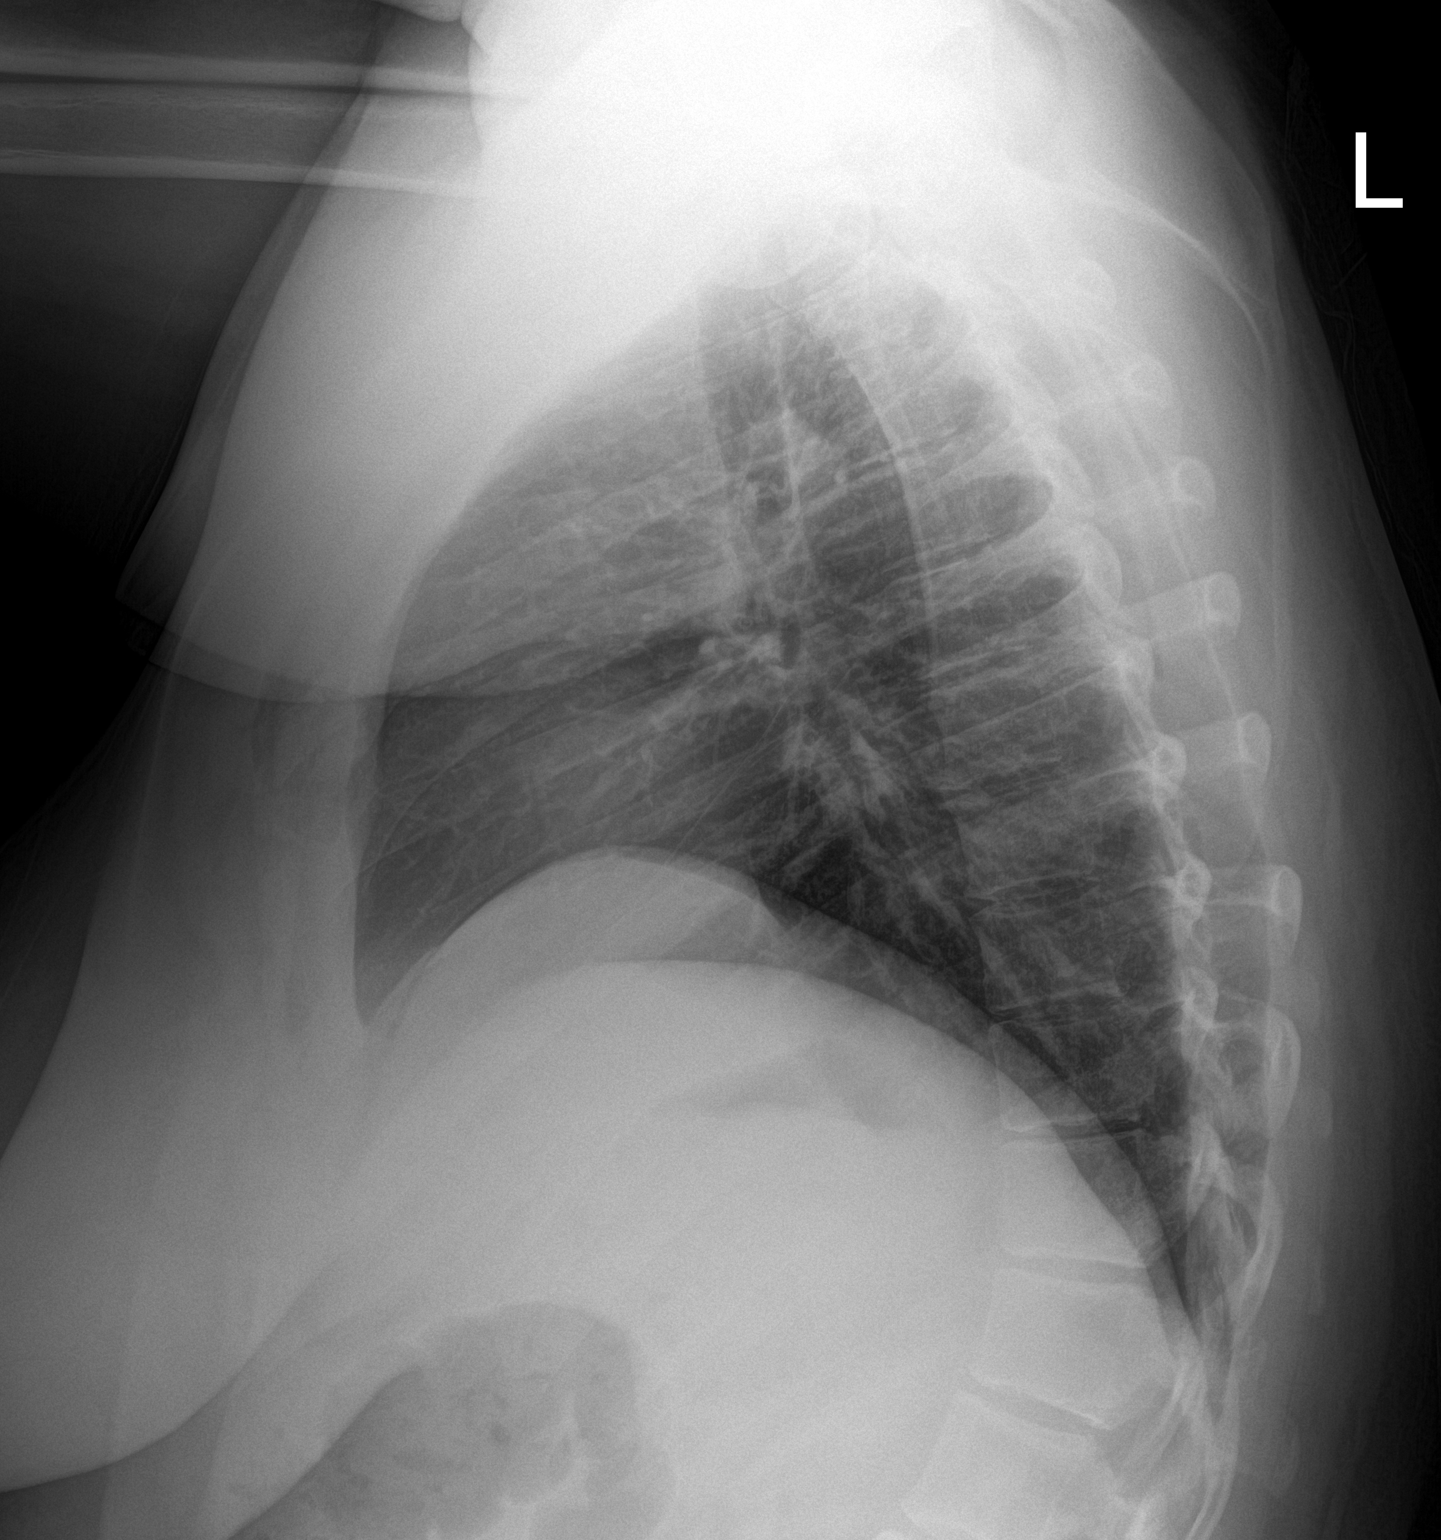

[2 of 2 positions shown; findings below may reference images not displayed]

FINDINGS: Cardiomediastinal silhouette is unremarkable. Mildly elevated RIGHT
hemidiaphragm. The lungs are clear without pleural effusions or
focal consolidations. Trachea projects midline and there is no
pneumothorax. Soft tissue planes and included osseous structures are
non-suspicious. Mild thoracolumbar kyphosis, unchanged.
IMPRESSION: No acute cardiopulmonary process, stable appearance of the chest
from May 14, 2013.

  By: Jamarcus Baltodano

## 2017-06-15 ENCOUNTER — Encounter (HOSPITAL_BASED_OUTPATIENT_CLINIC_OR_DEPARTMENT_OTHER): Payer: Self-pay | Admitting: Emergency Medicine

## 2017-06-15 ENCOUNTER — Emergency Department (HOSPITAL_BASED_OUTPATIENT_CLINIC_OR_DEPARTMENT_OTHER)
Admission: EM | Admit: 2017-06-15 | Discharge: 2017-06-15 | Disposition: A | Discharge status: Home / Self Care | Payer: BLUE CROSS/BLUE SHIELD | Attending: Emergency Medicine | Admitting: Emergency Medicine

## 2017-06-15 ENCOUNTER — Other Ambulatory Visit: Payer: Self-pay

## 2017-06-15 DIAGNOSIS — M5441 Lumbago with sciatica, right side: Secondary | ICD-10-CM | POA: Insufficient documentation

## 2017-06-15 DIAGNOSIS — Z79899 Other long term (current) drug therapy: Secondary | ICD-10-CM | POA: Insufficient documentation

## 2017-06-15 DIAGNOSIS — M545 Low back pain: Secondary | ICD-10-CM | POA: Diagnosis present

## 2017-06-15 DIAGNOSIS — Z87891 Personal history of nicotine dependence: Secondary | ICD-10-CM | POA: Diagnosis not present

## 2017-06-15 DIAGNOSIS — M546 Pain in thoracic spine: Secondary | ICD-10-CM | POA: Insufficient documentation

## 2017-06-15 DIAGNOSIS — G8929 Other chronic pain: Secondary | ICD-10-CM | POA: Diagnosis not present

## 2017-06-15 MED ORDER — ACETAMINOPHEN 500 MG PO TABS: 1000.0000 mg | ORAL_TABLET | Freq: Three times a day (TID) | ORAL | 0 refills | Status: AC

## 2017-06-15 MED ORDER — NAPROXEN SODIUM 220 MG PO TABS: 220.0000 mg | ORAL_TABLET | Freq: Two times a day (BID) | ORAL | 0 refills | Status: AC

## 2017-06-15 MED ORDER — CYCLOBENZAPRINE HCL 10 MG PO TABS: 10.0000 mg | ORAL_TABLET | Freq: Every day | ORAL | 0 refills | Status: AC

## 2017-06-15 MED FILL — CYCLOBENZAPRINE HCL 10 MG T: 10 | 10 days supply | Qty: 10 | Fill #0

## 2017-06-15 MED FILL — ACETAMINOPHEN EXTRA STRENGT: 500 | 17 days supply | Qty: 100 | Fill #0

## 2017-06-15 MED FILL — ALL DAY RELIEF 220 MG TABS: 220 | 13 days supply | Qty: 50 | Fill #0

## 2017-06-15 NOTE — Discharge Instructions (Signed)
You may use over-the-counter Motrin (Ibuprofen), Acetaminophen (Tylenol), topical muscle creams such as SalonPas, Icy Hot, Bengay, etc. Please stretch, apply heat, and have massage therapy for additional assistance. ° °

## 2017-06-15 NOTE — ED Triage Notes (Signed)
R lower back pain since yesterday, denies injury. Pain radiates down R leg

## 2017-06-15 NOTE — ED Provider Notes (Signed)
[facility] EMERGENCY DEPARTMENT Provider Note  CSN: [id number] Arrival date & time: 06/15/17 4098  Chief Complaint(s) Back Pain  HPI Melinda [name] is a 32 y.o. female   The history is provided by the patient.  Back Pain   This is a recurrent problem. Episode onset: 4-6 months. The problem occurs constantly. Progression since onset: fluctuating. The pain is associated with no known injury. The pain is present in the thoracic spine, lumbar spine and sacro-iliac joint. The quality of the pain is described as aching, cramping and shooting. The pain radiates to the right thigh and right knee. Pain severity now: Moderate to severe. The symptoms are aggravated by twisting. Pertinent negatives include no chest pain, no fever, no numbness, no abdominal pain, no abdominal swelling, no bowel incontinence, no perianal numbness, no bladder incontinence, no dysuria, no leg pain and no tingling. She has tried heat, muscle relaxants and analgesics for the symptoms. The treatment provided mild relief. Risk factors include obesity.    Past Medical History Past Medical History:  Diagnosis Date  . Chlamydia   . Headache(784.0)   . Kidney calculi    stones x2 in 2011  . Kidney stones    Patient Active Problem List   Diagnosis Date Noted  . Normal pregnancy 04/08/2011  . Rh negative state in antepartum period 04/08/2011   Home Medication(s) Prior to Admission medications   Medication Sig Start Date End Date Taking? Authorizing Provider  medroxyPROGESTERone (PROVERA) 10 MG tablet Take 10 mg by mouth as directed.   Yes [provider]  acetaminophen (TYLENOL) 500 MG tablet Take 2 tablets (1,000 mg total) by mouth every 8 (eight) hours for 5 days. Do not take more than 4000 mg of acetaminophen (Tylenol) in a 24-hour period. Please note that other medicines that you may be prescribed may have Tylenol as well. 06/15/17 06/20/17  Nira Conn, MD  Alum & Mag Hydroxide-Simeth (MAGIC  MOUTHWASH) SOLN Take 5 mLs by mouth 3 (three) times daily as needed for mouth pain. 07/18/14   Palumbo, April, MD  benzonatate (TESSALON) 100 MG capsule Take 1 capsule (100 mg total) by mouth every 8 (eight) hours. 04/21/15   Alvira Monday, MD  cyclobenzaprine (FLEXERIL) 10 MG tablet Take 1 tablet (10 mg total) by mouth at bedtime for 10 days. 06/15/17 06/25/17  Nira Conn, MD  ibuprofen (ADVIL,MOTRIN) 800 MG tablet Take 1 tablet (800 mg total) by mouth 3 (three) times daily. 07/18/14   Palumbo, April, MD  naproxen sodium (ALEVE) 220 MG tablet Take 1-2 tablets (220-440 mg total) by mouth 2 (two) times daily with a meal for 10 days. 06/15/17 06/25/17  Nira Conn, MD                                                                                                                                    Past Surgical History Past Surgical History:  Procedure Laterality  Date  . ANTERIOR CRUCIATE LIGAMENT REPAIR     right knee  . right knee surgery     Family History No family history on file.  Social History Social History   Tobacco Use  . Smoking status: Former Smoker    Packs/day: 1.00    Last attempt to quit: 02/25/2011    Years since quitting: 6.3  . Smokeless tobacco: Never Used  Substance Use Topics  . Alcohol use: Yes  . Drug use: No   Allergies Patient has no known allergies.  Review of Systems Review of Systems  Constitutional: Negative for fever.  Cardiovascular: Negative for chest pain.  Gastrointestinal: Negative for abdominal pain and bowel incontinence.  Genitourinary: Negative for bladder incontinence and dysuria.  Musculoskeletal: Positive for back pain.  Neurological: Negative for tingling and numbness.   All other systems are reviewed and are negative for acute change except as noted in the HPI  Physical Exam Vital Signs  I have reviewed the triage vital signs BP 136/78 (BP Location: Right Arm)   Pulse 88   Temp 98.2 F (36.8 C) (Oral)    Resp 18   Ht 5\' 9"  (1.753 m)   Wt 117.9 kg (260 lb)   LMP 04/25/2017   SpO2 99%   BMI 38.40 kg/m   Physical Exam  Constitutional: She is oriented to person, place, and time. She appears well-developed and well-nourished. No distress.  HENT:  Head: Normocephalic and atraumatic.  Right Ear: External ear normal.  Left Ear: External ear normal.  Nose: Nose normal.  Eyes: Conjunctivae and EOM are normal. No scleral icterus.  Neck: Normal range of motion and phonation normal.  Cardiovascular: Normal rate and regular rhythm.  Pulmonary/Chest: Effort normal. No stridor. No respiratory distress.  Abdominal: She exhibits no distension.  Musculoskeletal: Normal range of motion. She exhibits no edema.       Thoracic back: She exhibits tenderness and spasm. She exhibits no bony tenderness.       Back:  Neurological: She is alert and oriented to person, place, and time.  Spine Exam: Strength: 5/5 throughout LE bilaterally (hip flexion/extension, adduction/abduction; knee flexion/extension; foot dorsiflexion/plantarflexion, inversion/eversion; great toe inversion) Sensation: Intact to light touch in proximal and distal LE bilaterally Reflexes: 1+ quadriceps and achilles reflexes   Skin: She is not diaphoretic.  Psychiatric: She has a normal mood and affect. Her behavior is normal.  Vitals reviewed.   ED Results and Treatments Labs (all labs ordered are listed, but only abnormal results are displayed) Labs Reviewed - No data to display                                                                                                                       EKG  EKG Interpretation  Date/Time:    Ventricular Rate:    PR Interval:    QRS Duration:   QT Interval:    QTC Calculation:   R Axis:     Text Interpretation:  Radiology No results found. Pertinent labs & imaging results that were available during my care of the patient were reviewed by me and considered in my medical  decision making (see chart for details).  Medications Ordered in ED Medications - No data to display                                                                                                                                  Procedures Procedures  (including critical care time)  Medical Decision Making / ED Course I have reviewed the nursing notes for this encounter and the patient's prior records (if available in EHR or on provided paperwork).    32 y.o. female presents with back pain in thoracic and lumbosacral area for several months with signs of radicular pain to right leg. No acute traumatic onset. No red flag symptoms of fever, weight loss, saddle anesthesia, weakness, fecal/urinary incontinence or urinary retention.   Suspect MSK etiology vs nerve impingement vs herniated disc.   Patient is scheduled to have a plain film tomorrow that was ordered by her primary care provider.  She was recommended to have physical therapy prior to obtaining an MRI.   No indication for imaging emergently. Patient was recommended to take short course of scheduled NSAIDs and engage in early mobility as definitive treatment. Return precautions discussed for worsening or new concerning symptoms.   The patient appears reasonably screened and/or stabilized for discharge and I doubt any other medical condition or other [facility] requiring further screening, evaluation, or treatment in the ED at this time prior to discharge.  The patient is safe for discharge with strict return precautions.  Final Clinical Impression(s) / ED Diagnoses Final diagnoses:  Chronic right-sided thoracic back pain  Chronic right-sided low back pain with right-sided sciatica   Disposition: Discharge  Condition: Good  I have discussed the results, Dx and Tx plan with the patient who expressed understanding and agree(s) with the plan. Discharge instructions discussed at great length. The patient was given strict return  precautions who verbalized understanding of the instructions. No further questions at time of discharge.    ED Discharge Orders        Ordered    acetaminophen (TYLENOL) 500 MG tablet  Every 8 hours     06/15/17 0940    naproxen sodium (ALEVE) 220 MG tablet  2 times daily with meals     06/15/17 0940    cyclobenzaprine (FLEXERIL) 10 MG tablet  Daily at bedtime     06/15/17 0940       Follow Up: Primary care provider   As scheduled      This chart was dictated using voice recognition software.  Despite best efforts to proofread,  errors can occur which can change the documentation meaning.   Nira Connardama, Pedro Eduardo, MD 06/15/17 [phone]

## 2017-09-09 IMAGING — CR DG CHEST 2V
2 series · 2 of 2 positions shown · non-contrast
Comparison: 07/18/2014 chest radiograph.

CLINICAL DATA: Cough and wheezing.

EXAM:
CHEST  2 VIEW

[w chest pa]
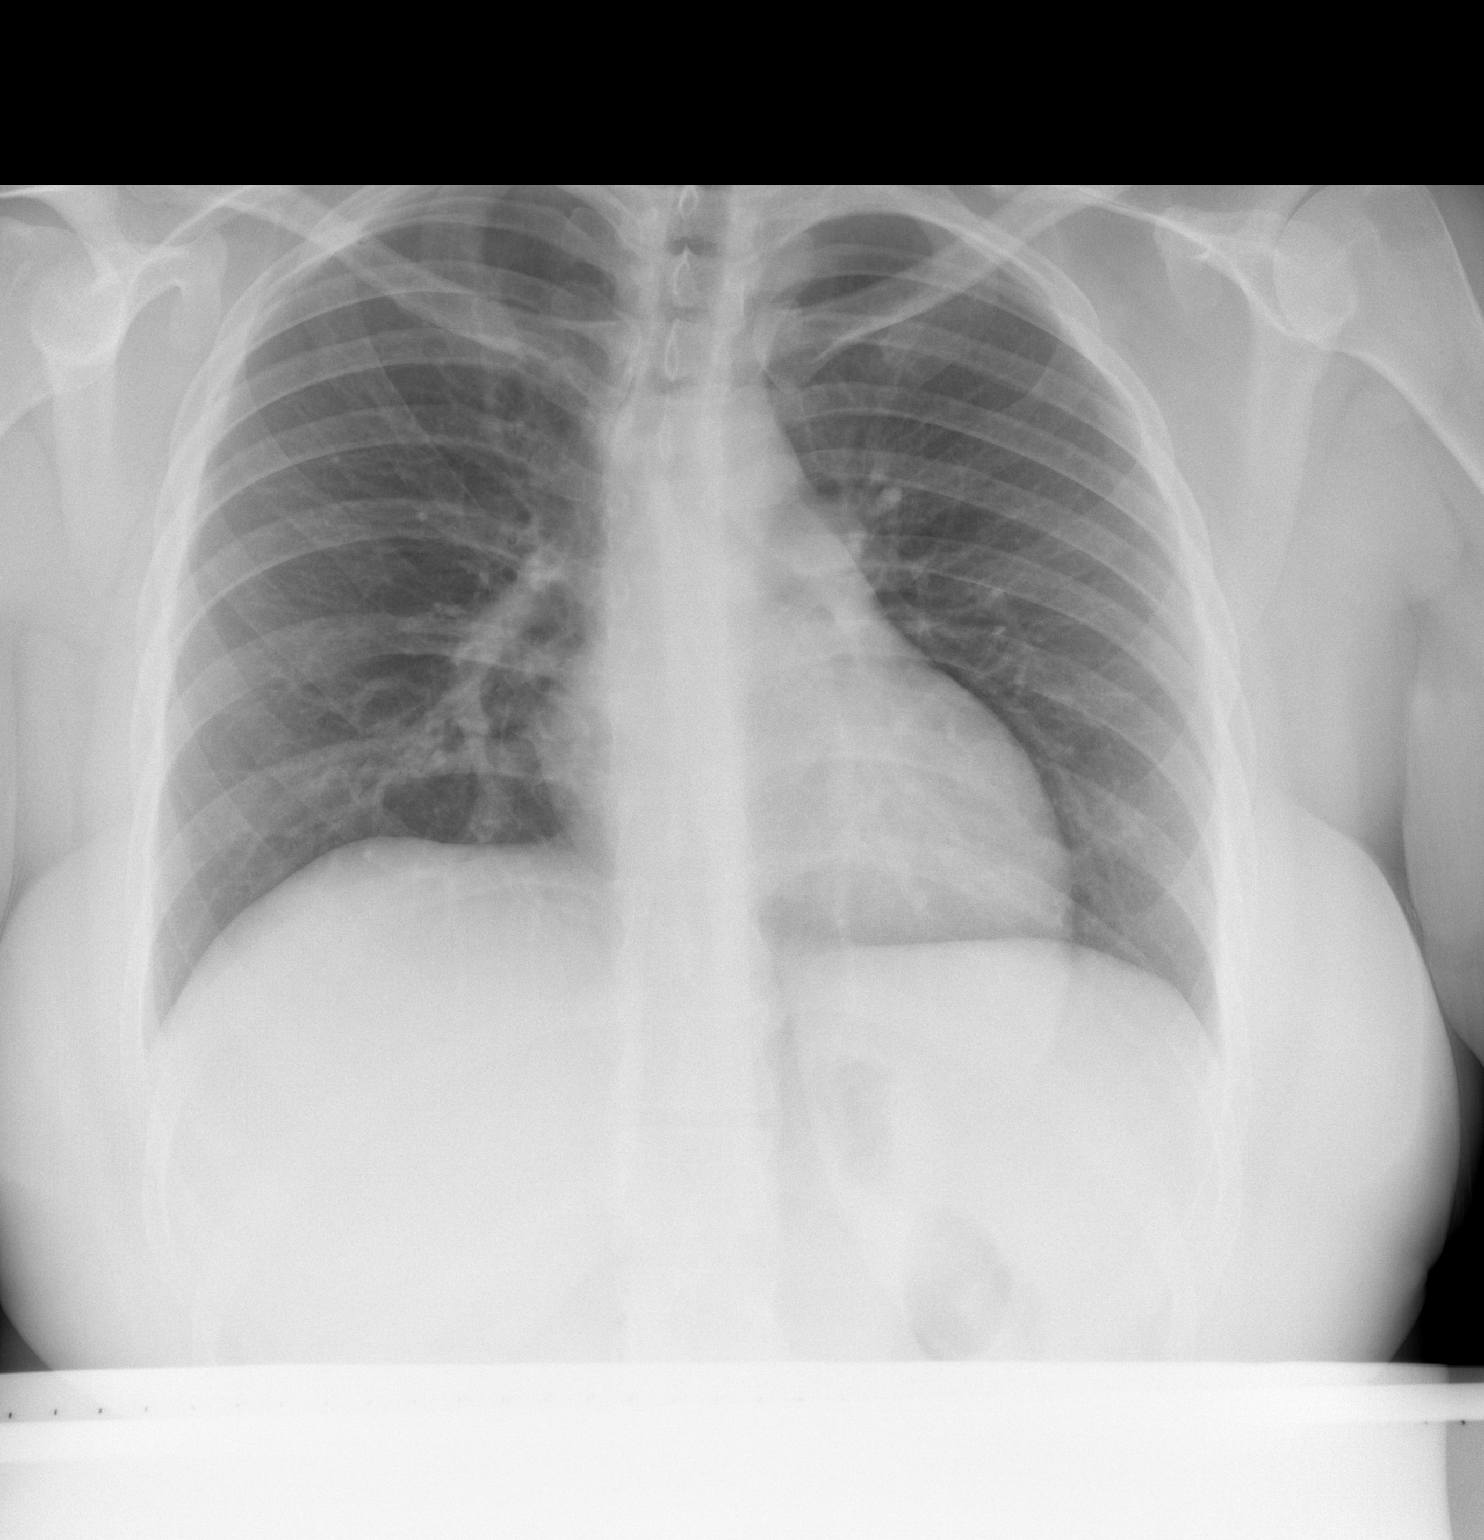

[w chest lat]
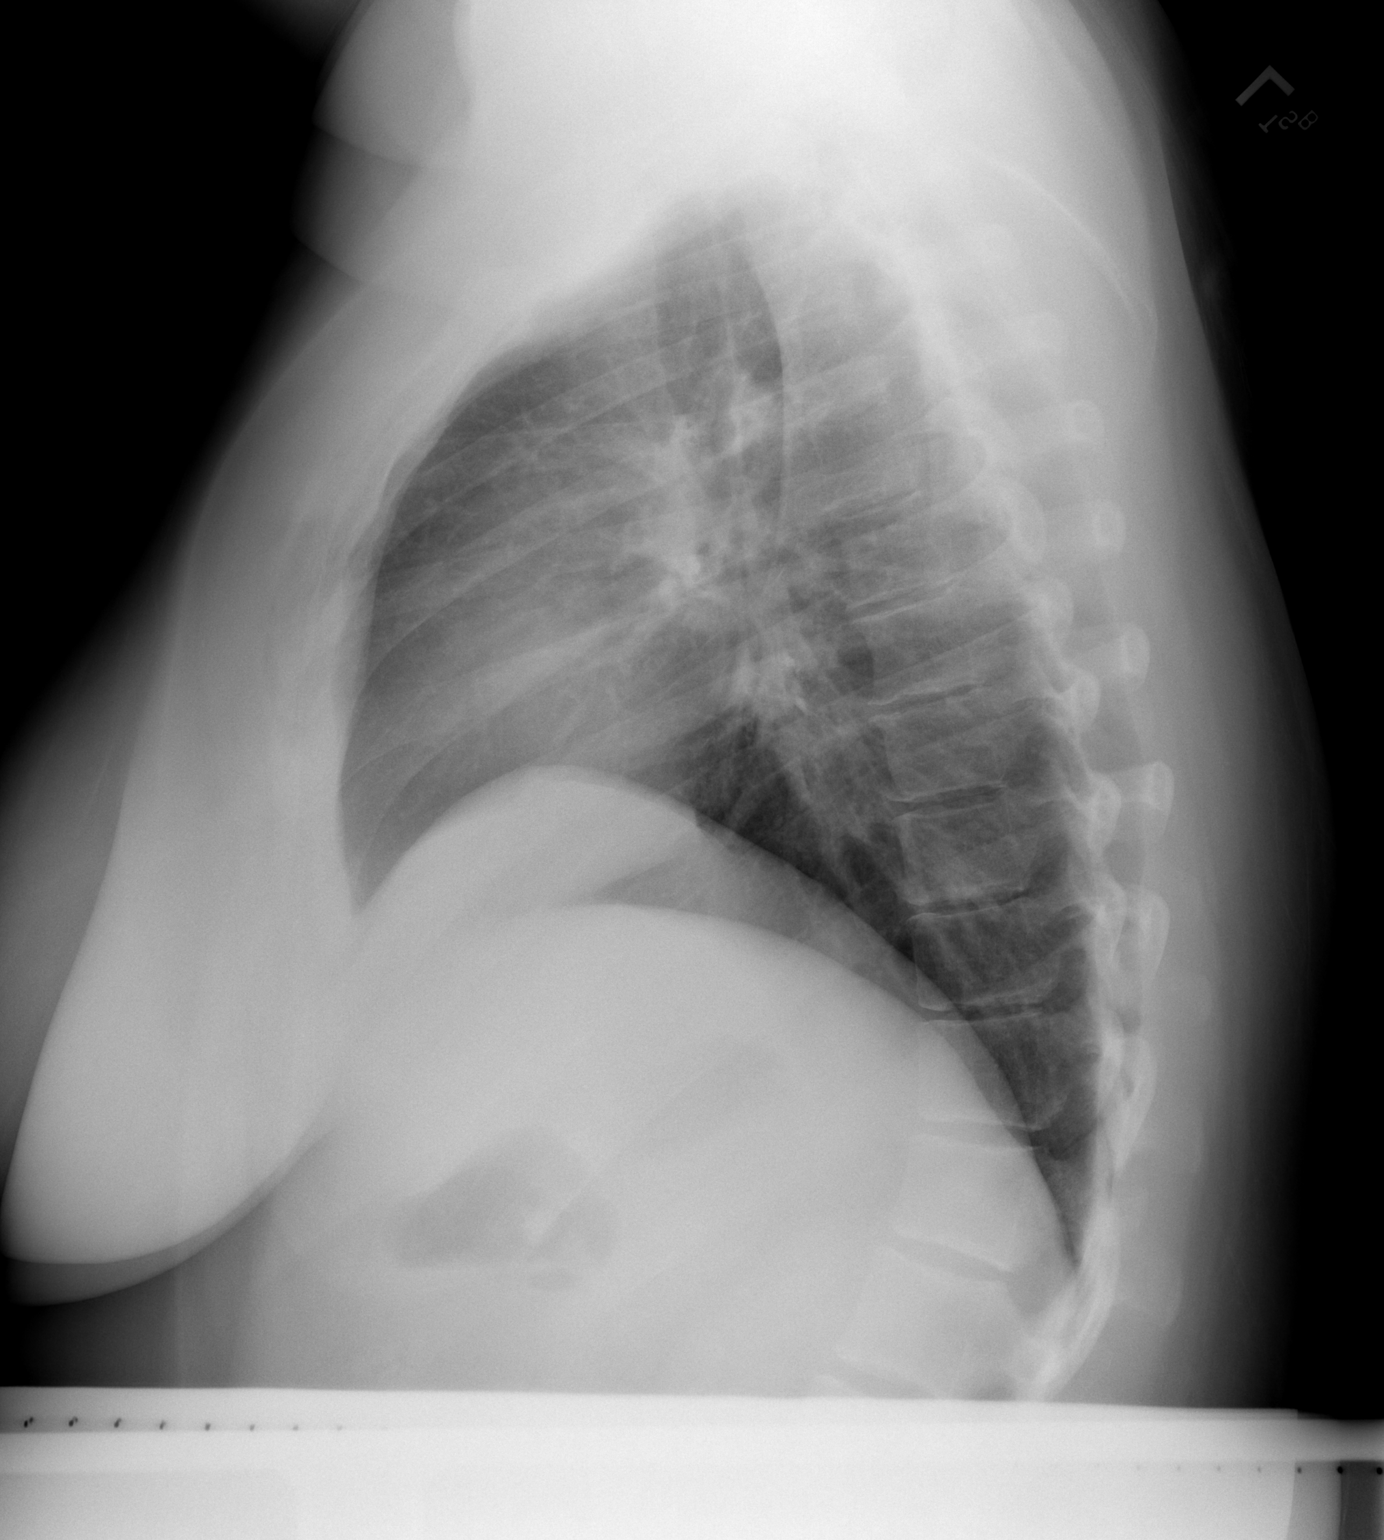

[2 of 2 positions shown; findings below may reference images not displayed]

FINDINGS: Stable cardiomediastinal silhouette with normal heart size. No
pneumothorax. No pleural effusion. Clear lungs, with no focal lung
consolidation and no pulmonary edema.
IMPRESSION: No active cardiopulmonary disease.

## 2019-01-21 ENCOUNTER — Other Ambulatory Visit: Payer: Self-pay

## 2019-01-21 DIAGNOSIS — Z20822 Contact with and (suspected) exposure to covid-19: Secondary | ICD-10-CM

## 2019-01-23 LAB — NOVEL CORONAVIRUS, NAA: SARS-CoV-2, NAA: NOT DETECTED

## 2019-01-23 LAB — SPECIMEN STATUS REPORT

## 2019-05-15 ENCOUNTER — Other Ambulatory Visit: Payer: Self-pay

## 2019-05-15 DIAGNOSIS — Z20822 Contact with and (suspected) exposure to covid-19: Secondary | ICD-10-CM

## 2019-05-17 LAB — NOVEL CORONAVIRUS, NAA: SARS-CoV-2, NAA: NOT DETECTED

## 2019-12-28 ENCOUNTER — Encounter (HOSPITAL_BASED_OUTPATIENT_CLINIC_OR_DEPARTMENT_OTHER): Payer: Self-pay | Admitting: Emergency Medicine

## 2019-12-28 ENCOUNTER — Other Ambulatory Visit: Payer: Self-pay

## 2019-12-28 ENCOUNTER — Emergency Department (HOSPITAL_BASED_OUTPATIENT_CLINIC_OR_DEPARTMENT_OTHER)
Admission: EM | Admit: 2019-12-28 | Discharge: 2019-12-29 | Disposition: A | Discharge status: Home / Self Care | Payer: BC Managed Care – PPO | Attending: Emergency Medicine | Admitting: Emergency Medicine

## 2019-12-28 DIAGNOSIS — R252 Cramp and spasm: Secondary | ICD-10-CM | POA: Diagnosis present

## 2019-12-28 DIAGNOSIS — E860 Dehydration: Secondary | ICD-10-CM | POA: Diagnosis not present

## 2019-12-28 DIAGNOSIS — Z87891 Personal history of nicotine dependence: Secondary | ICD-10-CM | POA: Insufficient documentation

## 2019-12-28 LAB — URINALYSIS, MICROSCOPIC (REFLEX)

## 2019-12-28 LAB — PREGNANCY, URINE: Preg Test, Ur: NEGATIVE

## 2019-12-28 LAB — COMPREHENSIVE METABOLIC PANEL
ALT: 22 U/L (ref 0–44)
AST: 20 U/L (ref 15–41)
Albumin: 4 g/dL (ref 3.5–5.0)
Alkaline Phosphatase: 58 U/L (ref 38–126)
Anion gap: 13 (ref 5–15)
BUN: 15 mg/dL (ref 6–20)
CO2: 26 mmol/L (ref 22–32)
Calcium: 9.2 mg/dL (ref 8.9–10.3)
Chloride: 98 mmol/L (ref 98–111)
Creatinine, Ser: 0.68 mg/dL (ref 0.44–1.00)
GFR calc Af Amer: >60 mL/min (ref >60)
GFR calc non Af Amer: >60 mL/min (ref >60)
Glucose, Bld: 111 mg/dL — ABNORMAL HIGH (ref 70–99)
Potassium: 3.9 mmol/L (ref 3.5–5.1)
Sodium: 137 mmol/L (ref 135–145)
Total Bilirubin: 0.2 mg/dL — ABNORMAL LOW (ref 0.3–1.2)
Total Protein: 7.7 g/dL (ref 6.5–8.1)

## 2019-12-28 LAB — CBC WITH DIFFERENTIAL/PLATELET
Abs Immature Granulocytes: 0.12 10*3/uL — ABNORMAL HIGH (ref 0.00–0.07)
Basophils Absolute: 0.1 10*3/uL (ref 0.0–0.1)
Basophils Relative: 0 %
Eosinophils Absolute: 0.1 10*3/uL (ref 0.0–0.5)
Eosinophils Relative: 1 %
HCT: 40.4 % (ref 36.0–46.0)
Hemoglobin: 13 g/dL (ref 12.0–15.0)
Immature Granulocytes: 1 %
Lymphocytes Relative: 28 %
Lymphs Abs: 3.9 10*3/uL (ref 0.7–4.0)
MCH: 27.1 pg (ref 26.0–34.0)
MCHC: 32.2 g/dL (ref 30.0–36.0)
MCV: 84.2 fL (ref 80.0–100.0)
Monocytes Absolute: 0.6 10*3/uL (ref 0.1–1.0)
Monocytes Relative: 4 %
Neutro Abs: 9.2 10*3/uL — ABNORMAL HIGH (ref 1.7–7.7)
Neutrophils Relative %: 66 %
Platelets: 380 10*3/uL (ref 150–400)
RBC: 4.8 MIL/uL (ref 3.87–5.11)
RDW: 13.1 % (ref 11.5–15.5)
WBC: 13.9 10*3/uL — ABNORMAL HIGH (ref 4.0–10.5)
nRBC: 0 % (ref 0.0–0.2)

## 2019-12-28 LAB — URINALYSIS, ROUTINE W REFLEX MICROSCOPIC
Bilirubin Urine: NEGATIVE
Glucose, UA: >=500 mg/dL — AB
Ketones, ur: NEGATIVE mg/dL
Nitrite: NEGATIVE
Protein, ur: NEGATIVE mg/dL
Specific Gravity, Urine: 1.02 (ref 1.005–1.030)
pH: 7 (ref 5.0–8.0)

## 2019-12-28 LAB — CK: Total CK: 104 U/L (ref 38–234)

## 2019-12-28 NOTE — ED Triage Notes (Signed)
Patient states that she has been outside in the sun all day at a track meet. Pt states that at about 2 or 3 she started to feel " gross" - she reports leg cramping, nausea and decrease in urination

## 2019-12-28 NOTE — ED Provider Notes (Signed)
[facility] EMERGENCY DEPARTMENT Provider Note   CSN: [id number] Arrival date & time: 12/28/19  2230     History Chief Complaint  Patient presents with  . Leg Cramps    Melinda [name] is a 34 y.o. female.  Melinda [name] is 34yo female without significant past medical history who presents to ED with worsening bilateral leg cramping and 1d hx of n/v. Reports intermittent leg cramping and poor po intake for the past week. Patient says she was at a track meet all day today in the sun. Later in the afternoon she says her leg cramps worsened and started feeling nauseous, dizzy. She reports non-bloody emesis as well. On presentation patient continues to be nauseous. Denies fevers, CP, SOB, abdominal pain.       Past Medical History:  Diagnosis Date  . Chlamydia   . Headache(784.0)   . Kidney calculi    stones x2 in 2011  . Kidney stones     Patient Active Problem List   Diagnosis Date Noted  . Normal pregnancy 04/08/2011  . Rh negative state in antepartum period 04/08/2011    Past Surgical History:  Procedure Laterality Date  . ANTERIOR CRUCIATE LIGAMENT REPAIR     right knee  . right knee surgery       OB History    Gravida  1   Para  0   Term  0   Preterm  0   AB  0   Living  0     SAB  0   TAB  0   Ectopic  0   Multiple  0   Live Births              History reviewed. No pertinent family history.  Social History   Tobacco Use  . Smoking status: Former Smoker    Packs/day: 1.00    Quit date: 02/25/2011    Years since quitting: 8.8  . Smokeless tobacco: Never Used  Substance Use Topics  . Alcohol use: Yes  . Drug use: No    Home Medications Prior to Admission medications   Medication Sig Start Date End Date Taking? Authorizing Provider  Alum & Mag Hydroxide-Simeth (MAGIC MOUTHWASH) SOLN Take 5 mLs by mouth 3 (three) times daily as needed for mouth pain. 07/18/14   Palumbo, April, MD  benzonatate (TESSALON) 100 MG capsule Take 1  capsule (100 mg total) by mouth every 8 (eight) hours. 04/21/15   Alvira Monday, MD  ibuprofen (ADVIL,MOTRIN) 800 MG tablet Take 1 tablet (800 mg total) by mouth 3 (three) times daily. 07/18/14   Palumbo, April, MD  medroxyPROGESTERone (PROVERA) 10 MG tablet Take 10 mg by mouth as directed.    [provider]    Allergies    Doxycycline  Review of Systems   Review of Systems  All other systems reviewed and are negative.   Physical Exam Updated Vital Signs BP (!) 144/80   Pulse 89   Temp 98.5 F (36.9 C) (Oral)   Resp 16   Wt (!) 127 kg   LMP 12/28/2019   SpO2 100%   BMI 41.35 kg/m   Physical Exam Constitutional:      General: She is awake. She is not in acute distress.    Appearance: Normal appearance.  HENT:     Head: Normocephalic and atraumatic.     Mouth/Throat:     Mouth: Mucous membranes are moist.  Eyes:     General: Lids are normal.  Conjunctiva/sclera: Conjunctivae normal.  Cardiovascular:     Rate and Rhythm: Normal rate and regular rhythm.     Pulses: Normal pulses.     Heart sounds: Normal heart sounds.  Pulmonary:     Effort: Pulmonary effort is normal.     Breath sounds: Normal breath sounds.  Abdominal:     General: There is no distension.     Palpations: Abdomen is soft.     Tenderness: There is no abdominal tenderness.  Musculoskeletal:     Right lower leg: No tenderness. No edema.     Left lower leg: No tenderness. No edema.  Neurological:     Mental Status: She is alert and oriented to person, place, and time.  Psychiatric:        Behavior: Behavior is cooperative.        Cognition and Memory: Cognition normal.     ED Results / Procedures / Treatments   Labs (all labs ordered are listed, but only abnormal results are displayed) Labs Reviewed  COMPREHENSIVE METABOLIC PANEL - Abnormal; Notable for the following components:      Result Value   Glucose, Bld 111 (*)    Total Bilirubin 0.2 (*)    All other components within  normal limits  CBC WITH DIFFERENTIAL/PLATELET - Abnormal; Notable for the following components:   WBC 13.9 (*)    Neutro Abs 9.2 (*)    Abs Immature Granulocytes 0.12 (*)    All other components within normal limits  URINALYSIS, ROUTINE W REFLEX MICROSCOPIC - Abnormal; Notable for the following components:   Glucose, UA >=500 (*)    Hgb urine dipstick LARGE (*)    Leukocytes,Ua SMALL (*)    All other components within normal limits  URINALYSIS, MICROSCOPIC (REFLEX) - Abnormal; Notable for the following components:   Bacteria, UA FEW (*)    All other components within normal limits  CK  PREGNANCY, URINE   EKG None  Radiology No results found.  Procedures Procedures (including critical care time)  Medications Ordered in ED Medications - No data to display  ED Course  I have reviewed the triage vital signs and the nursing notes.  Pertinent labs & imaging results that were available during my care of the patient were reviewed by me and considered in my medical decision making (see chart for details).   MDM Rules/Calculators/A&P  Patient is 34yo female without significant past medical history, presenting w/ leg cramping, n/v in setting of poor po intake. In room, patient comfortable, no acute distress, mm moist, no abdominal pain or leg pain. Kidney function normal, CK normal. UA w/ glucose, blood - patient states she is currently on her period. No electrolyte abnormalities. Overall, patient mildly dehydrated but stable for discharge. Patient wishes to not have IVF and prefers to po hydrate at home. Gave po Zofran prior to discharge for nausea, patient tolerated well. Discharging home, prescribed home Zofran prn.                         Final Clinical Impression(s) / ED Diagnoses Final diagnoses:  Dehydration, mild   Rx / DC Orders ED Discharge Orders         Ordered    ondansetron (ZOFRAN) 4 MG tablet  Daily PRN     Discontinue  Reprint     12/29/19 0029             Evlyn Kanner, MD 12/29/19 0233    Nira Conn,  MD 12/30/19 2993

## 2019-12-29 MED ORDER — ONDANSETRON 4 MG PO TBDP
4.0000 mg | ORAL_TABLET | Freq: Once | ORAL | Status: AC
  Administered 2019-12-29: 4 mg via ORAL
  Filled 2019-12-29: qty 1

## 2019-12-29 MED ORDER — ONDANSETRON HCL 4 MG PO TABS: 4.0000 mg | ORAL_TABLET | Freq: Every day | ORAL | 1 refills | Status: AC | PRN

## 2019-12-29 NOTE — Discharge Instructions (Addendum)
-  Hydrate with fluids like water and Gatorade. Avoid being in the sun for long periods of time. If you have to be outside, make sure you are wearing sunscreen and drinking plenty of fluids.  -Take Zofran 4mg  daily as needed for nausea -If symptoms do not improve or you develop new symptoms, visit your PCP.

## 2023-07-26 ENCOUNTER — Emergency Department (HOSPITAL_BASED_OUTPATIENT_CLINIC_OR_DEPARTMENT_OTHER)
Admission: EM | Admit: 2023-07-26 | Discharge: 2023-07-26 | Disposition: A | Discharge status: Home / Self Care | Payer: BC Managed Care – PPO

## 2023-07-26 ENCOUNTER — Other Ambulatory Visit: Payer: Self-pay

## 2023-07-26 ENCOUNTER — Emergency Department (HOSPITAL_BASED_OUTPATIENT_CLINIC_OR_DEPARTMENT_OTHER): Payer: BC Managed Care – PPO

## 2023-07-26 ENCOUNTER — Encounter (HOSPITAL_BASED_OUTPATIENT_CLINIC_OR_DEPARTMENT_OTHER): Payer: Self-pay

## 2023-07-26 DIAGNOSIS — R079 Chest pain, unspecified: Secondary | ICD-10-CM | POA: Diagnosis present

## 2023-07-26 DIAGNOSIS — H60331 Swimmer's ear, right ear: Secondary | ICD-10-CM | POA: Insufficient documentation

## 2023-07-26 DIAGNOSIS — D72829 Elevated white blood cell count, unspecified: Secondary | ICD-10-CM | POA: Diagnosis not present

## 2023-07-26 DIAGNOSIS — M25562 Pain in left knee: Secondary | ICD-10-CM | POA: Diagnosis not present

## 2023-07-26 DIAGNOSIS — R0789 Other chest pain: Secondary | ICD-10-CM | POA: Insufficient documentation

## 2023-07-26 DIAGNOSIS — H6123 Impacted cerumen, bilateral: Secondary | ICD-10-CM | POA: Insufficient documentation

## 2023-07-26 LAB — CBC
HCT: 39.5 % (ref 36.0–46.0)
Hemoglobin: 12.8 g/dL (ref 12.0–15.0)
MCH: 27.2 pg (ref 26.0–34.0)
MCHC: 32.4 g/dL (ref 30.0–36.0)
MCV: 84 fL (ref 80.0–100.0)
Platelets: 369 10*3/uL (ref 150–400)
RBC: 4.7 MIL/uL (ref 3.87–5.11)
RDW: 13.4 % (ref 11.5–15.5)
WBC: 11.9 10*3/uL — ABNORMAL HIGH (ref 4.0–10.5)
nRBC: 0 % (ref 0.0–0.2)

## 2023-07-26 LAB — D-DIMER, QUANTITATIVE: D-Dimer, Quant: <0.27 ug{FEU}/mL (ref 0.00–0.50)

## 2023-07-26 LAB — TROPONIN I (HIGH SENSITIVITY): Troponin I (High Sensitivity): <2 ng/L (ref <18)

## 2023-07-26 LAB — BASIC METABOLIC PANEL
Anion gap: 11 (ref 5–15)
BUN: 10 mg/dL (ref 6–20)
CO2: 21 mmol/L — ABNORMAL LOW (ref 22–32)
Calcium: 8.7 mg/dL — ABNORMAL LOW (ref 8.9–10.3)
Chloride: 104 mmol/L (ref 98–111)
Creatinine, Ser: 0.66 mg/dL (ref 0.44–1.00)
GFR, Estimated: >60 mL/min (ref >60)
Glucose, Bld: 151 mg/dL — ABNORMAL HIGH (ref 70–99)
Potassium: 4.1 mmol/L (ref 3.5–5.1)
Sodium: 136 mmol/L (ref 135–145)

## 2023-07-26 MED ORDER — NEOMYCIN-POLYMYXIN-HC 3.5-10000-1 OT SUSP: 4.0000 [drp] | Freq: Three times a day (TID) | OTIC | 0 refills | Status: AC

## 2023-07-26 NOTE — Discharge Instructions (Addendum)
 You have been seen today for your complaint of chest pain, left leg pain, right ear pain. Your lab work was reassuring and showed no abnormalities. Your imaging was reassuring and showed no abnormalities. Your discharge medications include Cortisporin.  This is an antibiotic for your right ear.  Take this until your symptoms improve and then take this for another 2 days. Follow up with: Your primary care provider Please seek immediate medical care if you develop any of the following symptoms: Your chest pain gets worse. You have a cough that gets worse, or you cough up blood. You have severe pain in your abdomen. You faint. You have sudden, unexplained chest discomfort. You have sudden, unexplained discomfort in your arms, back, neck, or jaw. You have shortness of breath at any time. You suddenly start to sweat, or your skin gets clammy. You feel nausea or you vomit. You suddenly feel lightheaded or dizzy. You have severe weakness, or unexplained weakness or fatigue. Your heart begins to beat quickly, or it feels like it is skipping beats. At this time there does not appear to be the presence of an emergent medical condition, however there is always the potential for conditions to change. Please read and follow the below instructions.  Do not take your medicine if  develop an itchy rash, swelling in your mouth or lips, or difficulty breathing; call 911 and seek immediate emergency medical attention if this occurs.  You may review your lab tests and imaging results in their entirety on your MyChart account.  Please discuss all results of fully with your primary care provider and other specialist at your follow-up visit.  Note: Portions of this text may have been transcribed using voice recognition software. Every effort was made to ensure accuracy; however, inadvertent computerized transcription errors may still be present.

## 2023-07-26 NOTE — ED Triage Notes (Signed)
 Pt reports that she is taking a medication that causes blood clots and she wants to r/o a blood clot.

## 2023-07-26 NOTE — ED Notes (Signed)
 Flushed pt R ear per EDP verbal order Pt tolerated flush appropriately

## 2023-07-26 NOTE — ED Triage Notes (Signed)
 Pt c/o left knee pain that started 3 weeks ago, now the pain has moved to her calf and upper chest area that started yesterday. Pt also c/o right ear pain.

## 2023-07-26 NOTE — ED Provider Notes (Signed)
 [facility] Provider Note   CSN: [id number] Arrival date & time: 07/26/23  1313     History  Chief Complaint  Patient presents with   Chest Pain   Leg Pain    Melinda [name] is a 38 y.o. female.  Presenting to the ED for evaluation of multiple complaints.  Complaints include left knee pain, calf pain, chest pain, ear pain.  She is concerned about blood clots.  She was on progesterone for uterine hyperplasia but stopped taking this medicine approximately 4 weeks ago due to the side effects.  She states 3 weeks ago she developed pain to the left patella.  The pain has since radiated to the lateral aspect of the knee and radiates down into the calf.  It is worse with ambulation.  She reports some mild swelling to the left knee as well.  She states yesterday she developed pain to the left anterior chest just below the shoulder.  That has since radiated to the left sternal border.  It is worsened with activity.  She has nausea at baseline.  No vomiting or diaphoresis.  No fevers or chills.  No cough.  No significant cardiac history.  No significant family cardiac history.  She reports shortness of breath with exertion.  She is also complaining of pain to the right ear.  States she is prone to swimmer's ear but has not been swimming lately.  She notes some drainage.    Chest Pain Leg Pain      Home Medications Prior to Admission medications   Medication Sig Start Date End Date Taking? Authorizing Provider  neomycin-polymyxin-hydrocortisone (CORTISPORIN) 3.5-10000-1 OTIC suspension Place 4 drops into the right ear 3 (three) times daily. 07/26/23  Yes Jazel Nimmons, Edsel Petrin, PA-C  Alum & Mag Hydroxide-Simeth (MAGIC MOUTHWASH) SOLN Take 5 mLs by mouth 3 (three) times daily as needed for mouth pain. 07/18/14   Palumbo, April, MD  benzonatate (TESSALON) 100 MG capsule Take 1 capsule (100 mg total) by mouth every 8 (eight) hours. 04/21/15   Alvira Monday,  MD  ibuprofen (ADVIL,MOTRIN) 800 MG tablet Take 1 tablet (800 mg total) by mouth 3 (three) times daily. 07/18/14   Palumbo, April, MD  medroxyPROGESTERone (PROVERA) 10 MG tablet Take 10 mg by mouth as directed.    [provider]      Allergies    Doxycycline and Fish allergy    Review of Systems   Review of Systems  Cardiovascular:  Positive for chest pain.  All other systems reviewed and are negative.   Physical Exam Updated Vital Signs BP (!) 145/87   Pulse 85   Temp 97.7 F (36.5 C)   Resp 20   Ht 5\' 8"  (1.727 m)   Wt 128.4 kg   SpO2 97%   BMI 43.03 kg/m  Physical Exam Vitals and nursing note reviewed.  Constitutional:      General: She is not in acute distress.    Appearance: Normal appearance. She is obese. She is not ill-appearing.     Comments: Resting comfortably in bed  HENT:     Head: Normocephalic and atraumatic.     Ears:     Comments: Bilateral cerumen impaction Cardiovascular:     Rate and Rhythm: Normal rate and regular rhythm.  Pulmonary:     Effort: Pulmonary effort is normal. No respiratory distress.     Breath sounds: No decreased breath sounds, wheezing, rhonchi or rales.  Abdominal:  General: Abdomen is flat.  Musculoskeletal:        General: Normal range of motion.     Cervical back: Neck supple.     Right lower leg: No edema.     Left lower leg: No edema.     Comments: TTP to left knee in the suprapatellar region.  No swelling of bilateral lower legs appreciated.  No erythema.  DP pulses 1+ bilaterally.  Skin:    General: Skin is warm and dry.  Neurological:     Mental Status: She is alert and oriented to person, place, and time.  Psychiatric:        Mood and Affect: Mood normal.        Behavior: Behavior normal.     ED Results / Procedures / Treatments   Labs (all labs ordered are listed, but only abnormal results are displayed) Labs Reviewed  BASIC METABOLIC PANEL - Abnormal; Notable for the following components:       Result Value   CO2 21 (*)    Glucose, Bld 151 (*)    Calcium 8.7 (*)    All other components within normal limits  CBC - Abnormal; Notable for the following components:   WBC 11.9 (*)    All other components within normal limits  D-DIMER, QUANTITATIVE  PREGNANCY, URINE  TROPONIN I (HIGH SENSITIVITY)    EKG None  Radiology DG Knee Complete 4 Views Left Result Date: 07/26/2023 CLINICAL DATA:  Left knee pain starting 3 weeks ago. EXAM: LEFT KNEE - COMPLETE 4+ VIEW COMPARISON:  None Available. FINDINGS: Mild-to-moderate joint effusion. Mild chronic enthesopathic change at the patellar tendon origin off the inferior patella. Joint spaces are preserved. Minimal tricompartmental peripheral degenerative spurring. No acute fracture or dislocation. IMPRESSION: 1. Minimal tricompartmental degenerative spurring. 2. Small joint effusion. Electronically Signed   By: Neita Garnet M.D.   On: 07/26/2023 15:04   DG Chest 2 View Result Date: 07/26/2023 CLINICAL DATA:  Chest pain. EXAM: CHEST - 2 VIEW COMPARISON:  Chest radiograph dated 04/21/2015. FINDINGS: The heart size and mediastinal contours are within normal limits. Both lungs are clear. The visualized skeletal structures are unremarkable. IMPRESSION: No active cardiopulmonary disease. Electronically Signed   By: Elgie Collard M.D.   On: 07/26/2023 14:06    Procedures Procedures    Medications Ordered in ED Medications - No data to display  ED Course/ Medical Decision Making/ A&P             HEART Score: 1                    Medical Decision Making Amount and/or Complexity of Data Reviewed Labs: ordered. Radiology: ordered.  Risk Prescription drug management.  This patient presents to the ED for concern of chest pain, shortness of breath, leg pain, this involves an extensive number of treatment options, and is a complaint that carries with it a high risk of complications and morbidity.  The emergent differential diagnosis of chest  pain includes: Acute coronary syndrome, pericarditis, aortic dissection, pulmonary embolism, tension pneumothorax, and esophageal rupture.  I do not believe the patient has an emergent cause of chest pain, other urgent/non-acute considerations include, but are not limited to: chronic angina, aortic stenosis, cardiomyopathy, myocarditis, mitral valve prolapse, pulmonary hypertension, hypertrophic obstructive cardiomyopathy (HOCM), aortic insufficiency, right ventricular hypertrophy, pneumonia, pleuritis, bronchitis, pneumothorax, tumor, gastroesophageal reflux disease (GERD), esophageal spasm, Mallory-Weiss syndrome, peptic ulcer disease, biliary disease, pancreatitis, functional gastrointestinal pain, cervical or thoracic disk disease or  arthritis, shoulder arthritis, costochondritis, subacromial bursitis, anxiety or panic attack, herpes zoster, breast disorders, chest wall tumors, thoracic outlet syndrome, mediastinitis.  My initial workup includes ACS rule out  Additional history obtained from: Nursing notes from this visit. Mother at bedside provides a portion of the history  I ordered, reviewed and interpreted labs which include: BMP, CBC, D-dimer, troponin.  Mild leukocytosis of 11.9.  No anemia.  No electrolyte derangement or kidney dysfunction.  Bicarb 21 with a normal anion gap.  Troponin negative.  I ordered imaging studies including chest x-ray I independently visualized and interpreted imaging which showed negative I agree with the radiologist interpretation  Cardiac Monitoring:  The patient was maintained on a cardiac monitor.  I personally viewed and interpreted the cardiac monitored which showed an underlying rhythm of: NSR  Afebrile, hypertensive but otherwise hemodynamically stable.  38 year old female presents to the ED for evaluation of multiple complaints.  Her main concern is DVT with progression into PE as she has been on progesterone up until 1 month ago, has left calf and  knee pain and left-sided chest pain.  She appears very well on physical exam.  No obvious swelling of the left lower extremity, however difficult to fully appreciate due to body habitus.  Neurovascular status is intact.  There is some tenderness to palpation of the suprapatellar region.  She is ambulatory without difficulty.  Lab workup was overall very reassuring.  EKG without acute ischemic changes, troponin negative, chest x-ray negative.  Low suspicion for ACS.  D-dimer negative and minimal risk factors, very low suspicion for PE.  Suspect musculoskeletal etiology for left lower extremity pain.  She is also complaining of right ear pain.  States she is prone to swimmer's ear but has not been swimming lately.   At this time there does not appear to be any evidence of an acute emergency medical condition and the patient appears stable for discharge with appropriate outpatient follow up. Diagnosis was discussed with patient who verbalizes understanding of care plan and is agreeable to discharge. I have discussed return precautions with patient and mother who verbalizes understanding. Patient encouraged to follow-up with their PCP within 1 week. All questions answered.  Note: Portions of this report may have been transcribed using voice recognition software. Every effort was made to ensure accuracy; however, inadvertent computerized transcription errors may still be present.        Final Clinical Impression(s) / ED Diagnoses Final diagnoses:  Atypical chest pain  Acute pain of left knee  Acute swimmer's ear of right side    Rx / DC Orders ED Discharge Orders          Ordered    neomycin-polymyxin-hydrocortisone (CORTISPORIN) 3.5-10000-1 OTIC suspension  3 times daily        07/26/23 1512              Michelle Piper, PA-C 07/26/23 1520    [place], [place] 07/27/23 [phone]
